# Patient Record
Sex: Female | Born: 1970 | Race: White | Hispanic: Yes | Marital: Married | Smoking: Never smoker
Health system: Southern US, Community
[De-identification: ages and names within clinical notes are randomized; demographics above are authoritative.]

## PROBLEM LIST (undated history)

## (undated) DIAGNOSIS — N979 Female infertility, unspecified: Secondary | ICD-10-CM

## (undated) DIAGNOSIS — F419 Anxiety disorder, unspecified: Secondary | ICD-10-CM

## (undated) DIAGNOSIS — N809 Endometriosis, unspecified: Secondary | ICD-10-CM

## (undated) DIAGNOSIS — I341 Nonrheumatic mitral (valve) prolapse: Secondary | ICD-10-CM

## (undated) DIAGNOSIS — E349 Endocrine disorder, unspecified: Secondary | ICD-10-CM

## (undated) DIAGNOSIS — N200 Calculus of kidney: Secondary | ICD-10-CM

## (undated) HISTORY — PX: OTHER SURGICAL HISTORY: SHX169

## (undated) HISTORY — DX: Female infertility, unspecified: N97.9

## (undated) HISTORY — DX: Calculus of kidney: N20.0

## (undated) HISTORY — DX: Nonrheumatic mitral (valve) prolapse: I34.1

## (undated) HISTORY — DX: Endocrine disorder, unspecified: E34.9

---

## 2015-12-17 DIAGNOSIS — N200 Calculus of kidney: Secondary | ICD-10-CM | POA: Insufficient documentation

## 2015-12-21 ENCOUNTER — Emergency Department (HOSPITAL_BASED_OUTPATIENT_CLINIC_OR_DEPARTMENT_OTHER)
Admission: EM | Admit: 2015-12-21 | Discharge: 2015-12-22 | Disposition: A | Discharge status: Home / Self Care | Payer: BLUE CROSS/BLUE SHIELD | Attending: Emergency Medicine | Admitting: Emergency Medicine

## 2015-12-21 ENCOUNTER — Emergency Department (HOSPITAL_BASED_OUTPATIENT_CLINIC_OR_DEPARTMENT_OTHER): Payer: BLUE CROSS/BLUE SHIELD

## 2015-12-21 ENCOUNTER — Encounter (HOSPITAL_BASED_OUTPATIENT_CLINIC_OR_DEPARTMENT_OTHER): Payer: Self-pay | Admitting: *Deleted

## 2015-12-21 DIAGNOSIS — R109 Unspecified abdominal pain: Secondary | ICD-10-CM | POA: Diagnosis present

## 2015-12-21 DIAGNOSIS — Z3202 Encounter for pregnancy test, result negative: Secondary | ICD-10-CM | POA: Insufficient documentation

## 2015-12-21 DIAGNOSIS — F419 Anxiety disorder, unspecified: Secondary | ICD-10-CM | POA: Insufficient documentation

## 2015-12-21 DIAGNOSIS — N200 Calculus of kidney: Secondary | ICD-10-CM

## 2015-12-21 HISTORY — DX: Anxiety disorder, unspecified: F41.9

## 2015-12-21 LAB — URINALYSIS, ROUTINE W REFLEX MICROSCOPIC
BILIRUBIN URINE: NEGATIVE
Glucose, UA: NEGATIVE mg/dL
KETONES UR: NEGATIVE mg/dL
NITRITE: NEGATIVE
PH: 5.5 (ref 5.0–8.0)
PROTEIN: NEGATIVE mg/dL
Specific Gravity, Urine: 1.019 (ref 1.005–1.030)

## 2015-12-21 LAB — URINE MICROSCOPIC-ADD ON

## 2015-12-21 LAB — PREGNANCY, URINE: Preg Test, Ur: NEGATIVE

## 2015-12-21 MED ORDER — OXYCODONE-ACETAMINOPHEN 5-325 MG PO TABS: 1.0000 | ORAL_TABLET | Freq: Four times a day (QID) | ORAL | Status: DC | PRN

## 2015-12-21 MED ORDER — ONDANSETRON HCL 4 MG/2ML IJ SOLN
4.0000 mg | Freq: Once | INTRAMUSCULAR | Status: AC
  Administered 2015-12-21: 4 mg via INTRAVENOUS
  Filled 2015-12-21: qty 2

## 2015-12-21 MED ORDER — KETOROLAC TROMETHAMINE 30 MG/ML IJ SOLN
30.0000 mg | Freq: Once | INTRAMUSCULAR | Status: AC
  Administered 2015-12-21: 30 mg via INTRAVENOUS
  Filled 2015-12-21: qty 1

## 2015-12-21 MED ORDER — MORPHINE SULFATE (PF) 4 MG/ML IV SOLN
4.0000 mg | Freq: Once | INTRAVENOUS | Status: AC
  Administered 2015-12-21: 4 mg via INTRAVENOUS
  Filled 2015-12-21: qty 1

## 2015-12-21 MED ORDER — ONDANSETRON 8 MG PO TBDP: ORAL_TABLET | ORAL | Status: DC

## 2015-12-21 NOTE — ED Notes (Signed)
Abdominal pain in her right flank. Kidney stone in her left flank last week.

## 2015-12-21 NOTE — Discharge Instructions (Signed)
Percocet as prescribed as needed for pain. Zofran as prescribed as needed for nausea.  Follow-up with [facility] if you have not past the stone in the next 2-3 days. The contact information for their office has been provided in this discharge summary.   Kidney Stones Kidney stones (urolithiasis) are deposits that form inside your kidneys. The intense pain is caused by the stone moving through the urinary tract. When the stone moves, the ureter goes into spasm around the stone. The stone is usually passed in the urine.  CAUSES   A disorder that makes certain neck glands produce too much parathyroid hormone (primary hyperparathyroidism).  A buildup of uric acid crystals, similar to gout in your joints.  Narrowing (stricture) of the ureter.  A kidney obstruction present at birth (congenital obstruction).  Previous surgery on the kidney or ureters.  Numerous kidney infections. SYMPTOMS   Feeling sick to your stomach (nauseous).  Throwing up (vomiting).  Blood in the urine (hematuria).  Pain that usually spreads (radiates) to the groin.  Frequency or urgency of urination. DIAGNOSIS   Taking a history and physical exam.  Blood or urine tests.  CT scan.  Occasionally, an examination of the inside of the urinary bladder (cystoscopy) is performed. TREATMENT   Observation.  Increasing your fluid intake.  Extracorporeal shock wave lithotripsy--This is a noninvasive procedure that uses shock waves to break up kidney stones.  Surgery may be needed if you have severe pain or persistent obstruction. There are various surgical procedures. Most of the procedures are performed with the use of small instruments. Only small incisions are needed to accommodate these instruments, so recovery time is minimized. The size, location, and chemical composition are all important variables that will determine the proper choice of action for you. Talk to your health care provider to better  understand your situation so that you will minimize the risk of injury to yourself and your kidney.  HOME CARE INSTRUCTIONS   Drink enough water and fluids to keep your urine clear or pale yellow. This will help you to pass the stone or stone fragments.  Strain all urine through the provided strainer. Keep all particulate matter and stones for your health care provider to see. The stone causing the pain may be as small as a grain of salt. It is very important to use the strainer each and every time you pass your urine. The collection of your stone will allow your health care provider to analyze it and verify that a stone has actually passed. The stone analysis will often identify what you can do to reduce the incidence of recurrences.  Only take over-the-counter or prescription medicines for pain, discomfort, or fever as directed by your health care provider.  Keep all follow-up visits as told by your health care provider. This is important.  Get follow-up X-rays if required. The absence of pain does not always mean that the stone has passed. It may have only stopped moving. If the urine remains completely obstructed, it can cause loss of kidney function or even complete destruction of the kidney. It is your responsibility to make sure X-rays and follow-ups are completed. Ultrasounds of the kidney can show blockages and the status of the kidney. Ultrasounds are not associated with any radiation and can be performed easily in a matter of minutes.  Make changes to your daily diet as told by your health care provider. You may be told to:  Limit the amount of salt that you eat.  Eat 5 or more servings of fruits and vegetables each day.  Limit the amount of meat, poultry, fish, and eggs that you eat.  Collect a 24-hour urine sample as told by your health care provider.You may need to collect another urine sample every 6-12 months. SEEK MEDICAL CARE IF:  You experience pain that is progressive and  unresponsive to any pain medicine you have been prescribed. SEEK IMMEDIATE MEDICAL CARE IF:   Pain cannot be controlled with the prescribed medicine.  You have a fever or shaking chills.  The severity or intensity of pain increases over 18 hours and is not relieved by pain medicine.  You develop a new onset of abdominal pain.  You feel faint or pass out.  You are unable to urinate.   This information is not intended to replace advice given to you by your health care provider. Make sure you discuss any questions you have with your health care provider.   Document Released: 10/24/2005 Document Revised: 07/15/2015 Document Reviewed: 03/27/2013 Elsevier Interactive Patient Education [employer]

## 2015-12-21 NOTE — ED Provider Notes (Signed)
CSN: [id number]     Arrival date & time 12/21/15  2005 History  By signing my name below, I, Linus Galas, attest that this documentation has been prepared under the direction and in the presence of Geoffery Lyons, MD. Electronically Signed: Linus Galas, ED Scribe. 12/21/2015. 8:40 PM.   Chief Complaint  Patient presents with  . Abdominal Pain   The history is provided by the patient. No language interpreter was used.   HPI Comments: Christy Mitchell is a 45 y.o. female with a PMHx of kidney stones and anxiety who presents to the Emergency Department complaining of right flank pain that began today. Pt states last week she was seen at [facility] and had blood work and x-ray imaging performed for left flank pain. She states they found a 7 mm kidney stone. Pt also reports urinary urgency but is unable to urinate. Pt denies any other symptoms at this time. Pt last had a kidney stone in 2010.  Past Medical History  Diagnosis Date  . Anxiety    Past Surgical History  Procedure Laterality Date  . Cesarean section     No family history on file. Social History  Substance Use Topics  . Smoking status: Never Smoker   . Smokeless tobacco: None  . Alcohol Use: No   OB History    No data available     Review of Systems  Gastrointestinal:       + right flank pain  Genitourinary: Positive for urgency.  All other systems reviewed and are negative.  Allergies  Review of patient's allergies indicates no known allergies.  Home Medications   Prior to Admission medications   Medication Sig Start Date End Date Taking? Authorizing Provider  FLUoxetine HCl (PROZAC PO) Take by mouth.   Yes Historical Provider, MD   BP 126/86 mmHg  Pulse 80  Temp(Src) 98.4 F (36.9 C) (Oral)  Resp 20  Ht  (1.778 m)  Wt 194 lb 0.1 oz (88 kg)  BMI 27.84 kg/m2  SpO2 100%   Physical Exam  Constitutional: She is oriented to person, place, and time. She appears well-developed and well-nourished. No  distress.  HENT:  Head: Normocephalic and atraumatic.  Mouth/Throat: Oropharynx is clear and moist. No oropharyngeal exudate.  Eyes: Conjunctivae and EOM are normal. Pupils are equal, round, and reactive to light.  Neck: Normal range of motion. Neck supple.  No meningismus.  Cardiovascular: Normal rate, regular rhythm, normal heart sounds and intact distal pulses.   No murmur heard. Pulmonary/Chest: Effort normal and breath sounds normal. No respiratory distress.  Abdominal: Soft. There is no tenderness. There is no rebound and no guarding.  TTP in the right flank and right mid abdomen.   Musculoskeletal: Normal range of motion. She exhibits no edema or tenderness.  Neurological: She is alert and oriented to person, place, and time. No cranial nerve deficit. She exhibits normal muscle tone. Coordination normal.  No ataxia on finger to nose bilaterally. No pronator drift. 5/5 strength throughout. CN 2-12 intact.Equal grip strength. Sensation intact.   Skin: Skin is warm.  Psychiatric: She has a normal mood and affect. Her behavior is normal.  Nursing note and vitals reviewed.   ED Course  Procedures   DIAGNOSTIC STUDIES: Oxygen Saturation is 100% on room air, normal  by my interpretation.    COORDINATION OF CARE: 8:32 PM Will order imaging and urinalysis.  Discussed treatment plan with pt at bedside and pt agreed to plan.  Labs Review Labs Reviewed  URINALYSIS, ROUTINE W REFLEX MICROSCOPIC (NOT AT [facility])    Imaging Review No results found. I have personally reviewed and evaluated these images and lab results as part of my medical decision-making.   MDM   Final diagnoses:  None    Patient presents here with complaints of severe right flank pain. She was diagnosed with a left-sided renal calculus last week, however this pain improved. Workup reveals hematuria on the UA and CT scan reveals a 4 mm stone in the right UVJ. Her pain is significantly improved with medications here  in the ER. She will be discharged with Percocet, Zofran, and when necessary follow-up with urology if her stone does not pass in a timely fashion.  I personally performed the services described in this documentation, which was scribed in my presence. The recorded information has been reviewed and is accurate.        Geoffery Lyons, MD 12/21/15 2306

## 2015-12-21 NOTE — ED Notes (Signed)
Pt. Has returned from Radiology after CT Renal study done.

## 2017-01-10 DIAGNOSIS — F4329 Adjustment disorder with other symptoms: Secondary | ICD-10-CM | POA: Insufficient documentation

## 2017-01-11 ENCOUNTER — Encounter (HOSPITAL_BASED_OUTPATIENT_CLINIC_OR_DEPARTMENT_OTHER): Payer: Self-pay | Admitting: Emergency Medicine

## 2017-01-11 ENCOUNTER — Emergency Department (HOSPITAL_BASED_OUTPATIENT_CLINIC_OR_DEPARTMENT_OTHER)
Admission: EM | Admit: 2017-01-11 | Discharge: 2017-01-11 | Disposition: A | Discharge status: Home / Self Care | Payer: BLUE CROSS/BLUE SHIELD | Attending: Emergency Medicine | Admitting: Emergency Medicine

## 2017-01-11 ENCOUNTER — Emergency Department (HOSPITAL_BASED_OUTPATIENT_CLINIC_OR_DEPARTMENT_OTHER): Payer: BLUE CROSS/BLUE SHIELD

## 2017-01-11 DIAGNOSIS — N201 Calculus of ureter: Secondary | ICD-10-CM

## 2017-01-11 DIAGNOSIS — R1032 Left lower quadrant pain: Secondary | ICD-10-CM | POA: Diagnosis present

## 2017-01-11 DIAGNOSIS — Z79899 Other long term (current) drug therapy: Secondary | ICD-10-CM | POA: Insufficient documentation

## 2017-01-11 DIAGNOSIS — R110 Nausea: Secondary | ICD-10-CM

## 2017-01-11 HISTORY — DX: Endometriosis, unspecified: N80.9

## 2017-01-11 LAB — COMPREHENSIVE METABOLIC PANEL
ALBUMIN: 3.4 g/dL — AB (ref 3.5–5.0)
ALK PHOS: 50 U/L (ref 38–126)
ALT: 10 U/L — AB (ref 14–54)
AST: 18 U/L (ref 15–41)
Anion gap: 5 (ref 5–15)
BUN: 13 mg/dL (ref 6–20)
CHLORIDE: 106 mmol/L (ref 101–111)
CO2: 23 mmol/L (ref 22–32)
CREATININE: 0.87 mg/dL (ref 0.44–1.00)
Calcium: 8.5 mg/dL — ABNORMAL LOW (ref 8.9–10.3)
GFR calc non Af Amer: >60 mL/min (ref >60)
GLUCOSE: 93 mg/dL (ref 65–99)
Potassium: 3.8 mmol/L (ref 3.5–5.1)
SODIUM: 134 mmol/L — AB (ref 135–145)
Total Bilirubin: 0.4 mg/dL (ref 0.3–1.2)
Total Protein: 7 g/dL (ref 6.5–8.1)

## 2017-01-11 LAB — URINALYSIS, ROUTINE W REFLEX MICROSCOPIC
BILIRUBIN URINE: NEGATIVE
GLUCOSE, UA: NEGATIVE mg/dL
Ketones, ur: NEGATIVE mg/dL
Leukocytes, UA: NEGATIVE
Nitrite: NEGATIVE
Protein, ur: NEGATIVE mg/dL
Specific Gravity, Urine: 1.012 (ref 1.005–1.030)
pH: 6 (ref 5.0–8.0)

## 2017-01-11 LAB — URINALYSIS, MICROSCOPIC (REFLEX): WBC, UA: NONE SEEN WBC/hpf (ref 0–5)

## 2017-01-11 LAB — CBC
HCT: 38.1 % (ref 36.0–46.0)
Hemoglobin: 12.7 g/dL (ref 12.0–15.0)
MCH: 28.1 pg (ref 26.0–34.0)
MCHC: 33.3 g/dL (ref 30.0–36.0)
MCV: 84.3 fL (ref 78.0–100.0)
PLATELETS: 256 10*3/uL (ref 150–400)
RBC: 4.52 MIL/uL (ref 3.87–5.11)
RDW: 14 % (ref 11.5–15.5)
WBC: 8.4 10*3/uL (ref 4.0–10.5)

## 2017-01-11 LAB — LIPASE, BLOOD: LIPASE: 18 U/L (ref 11–51)

## 2017-01-11 LAB — PREGNANCY, URINE: Preg Test, Ur: NEGATIVE

## 2017-01-11 MED ORDER — IOPAMIDOL (ISOVUE-300) INJECTION 61%
100.0000 mL | Freq: Once | INTRAVENOUS | Status: AC | PRN
  Administered 2017-01-11: 100 mL via INTRAVENOUS

## 2017-01-11 MED ORDER — ONDANSETRON 4 MG PO TBDP: 4.0000 mg | ORAL_TABLET | Freq: Three times a day (TID) | ORAL | 0 refills | Status: DC | PRN

## 2017-01-11 MED ORDER — ONDANSETRON HCL 4 MG/2ML IJ SOLN
4.0000 mg | Freq: Once | INTRAMUSCULAR | Status: AC
  Administered 2017-01-11: 4 mg via INTRAVENOUS
  Filled 2017-01-11: qty 2

## 2017-01-11 MED ORDER — SODIUM CHLORIDE 0.9 % IV BOLUS (SEPSIS)
1000.0000 mL | Freq: Once | INTRAVENOUS | Status: AC
  Administered 2017-01-11: 1000 mL via INTRAVENOUS

## 2017-01-11 MED ORDER — MORPHINE SULFATE (PF) 4 MG/ML IV SOLN
4.0000 mg | Freq: Once | INTRAVENOUS | Status: AC
  Administered 2017-01-11: 4 mg via INTRAVENOUS
  Filled 2017-01-11: qty 1

## 2017-01-11 MED FILL — ONDANSETRON ODT 4 MG TABLET: 4 | 3 days supply | Qty: 9 | Fill #0

## 2017-01-11 NOTE — ED Notes (Signed)
Two warm blankets given to Pt 

## 2017-01-11 NOTE — ED Notes (Signed)
ED Provider at bedside. 

## 2017-01-11 NOTE — ED Triage Notes (Signed)
LLQ abd pain with N/V x 3 days, [facility], given abx, pain meds and flomax for possible kidney stone. Pt states she has been unable to keep the meds down due to vomiting. Denies urinary symptoms.

## 2017-01-11 NOTE — ED Provider Notes (Signed)
[facility] Provider Note   CSN: [id number] Arrival date & time: 01/11/17  7829     History   Chief Complaint Chief Complaint  Patient presents with  . Abdominal Pain    HPI Christy Mitchell is a 46 y.o. female.  The history is provided by the patient and medical records.  Abdominal Pain   Associated symptoms include diarrhea, nausea and vomiting.    46 year old female with history of anxiety and endometriosis, presenting to the ED for abdominal pain. Reports this is been ongoing for the past 3 days. She was seen in urgent care yesterday and given antibiotics and pain medication as well as Flomax for possible kidney stone. Patient reports she does have a history of kidney stones, however this feels very different. She reports ongoing left upper and lower abdominal pain, nausea, vomiting, and diarrhea. No flank pain.  States she never had nausea or vomiting or with her prior kidney stones. She denies any urinary symptoms such as urinary frequency, dysuria, or difficulty urinating, which she had previously. She denies any fever. States she has tried taking the medications given to her yesterday, however she is not been able to hold them down. She denies any significant GI history. Only prior surgery was C-section.  Past Medical History:  Diagnosis Date  . Anxiety   . Endometriosis     There are no active problems to display for this patient.   Past Surgical History:  Procedure Laterality Date  . CESAREAN SECTION      OB History    No data available       Home Medications    Prior to Admission medications   Medication Sig Start Date End Date Taking? Authorizing Provider  amoxicillin-clavulanate (AUGMENTIN) 875-125 MG tablet Take 1 tablet by mouth 2 (two) times daily.   Yes Historical Provider, MD  diclofenac (VOLTAREN) 75 MG EC tablet Take 75 mg by mouth 2 (two) times daily.   Yes Historical Provider, MD  tamsulosin (FLOMAX) 0.4 MG CAPS capsule Take 0.4 mg  by mouth.   Yes Historical Provider, MD  FLUoxetine HCl (PROZAC PO) Take by mouth.    Historical Provider, MD  ondansetron (ZOFRAN ODT) 8 MG disintegrating tablet 8mg  ODT q4 hours prn nausea 12/21/15   Geoffery Lyons, MD  oxyCODONE-acetaminophen (PERCOCET) 5-325 MG tablet Take 1-2 tablets by mouth every 6 (six) hours as needed. 12/21/15   Geoffery Lyons, MD    Family History No family history on file.  Social History Social History  Substance Use Topics  . Smoking status: Never Smoker  . Smokeless tobacco: Never Used  . Alcohol use No     Allergies   Patient has no known allergies.   Review of Systems Review of Systems  Gastrointestinal: Positive for abdominal pain, diarrhea, nausea and vomiting.  All other systems reviewed and are negative.    Physical Exam Updated Vital Signs BP 132/89 (BP Location: Left Arm)   Pulse 95   Temp 98.2 F (36.8 C) (Oral)   Resp 18   Ht 5' 10.5" (1.791 m)   Wt 96.2 kg   LMP 01/09/2017   SpO2 99%   BMI 29.99 kg/m   Physical Exam  Constitutional: She is oriented to person, place, and time. She appears well-developed and well-nourished.  HENT:  Head: Normocephalic and atraumatic.  Mouth/Throat: Oropharynx is clear and moist.  Eyes: Conjunctivae and EOM are normal. Pupils are equal, round, and reactive to light.  Neck: Normal range of motion.  Cardiovascular:  Normal rate, regular rhythm and normal heart sounds.   Pulmonary/Chest: Effort normal and breath sounds normal. No respiratory distress. She has no wheezes.  Abdominal: Soft. Bowel sounds are normal. There is tenderness in the left upper quadrant and left lower quadrant. There is no rebound, no guarding and no CVA tenderness.  TTP in LLQ and LUQ, no rebound or guarding, no CVA tenderness  Musculoskeletal: Normal range of motion.  Neurological: She is alert and oriented to person, place, and time.  Skin: Skin is warm and dry.  Psychiatric: She has a normal mood and affect.  Nursing  note and vitals reviewed.    ED Treatments / Results  Labs (all labs ordered are listed, but only abnormal results are displayed) Labs Reviewed  COMPREHENSIVE METABOLIC PANEL - Abnormal; Notable for the following:       Result Value   Sodium 134 (*)    Calcium 8.5 (*)    Albumin 3.4 (*)    ALT 10 (*)    All other components within normal limits  URINALYSIS, ROUTINE W REFLEX MICROSCOPIC - Abnormal; Notable for the following:    Hgb urine dipstick MODERATE (*)    All other components within normal limits  URINALYSIS, MICROSCOPIC (REFLEX) - Abnormal; Notable for the following:    Bacteria, UA RARE (*)    Squamous Epithelial / LPF 0-5 (*)    All other components within normal limits  LIPASE, BLOOD  CBC  PREGNANCY, URINE    EKG  EKG Interpretation None       Radiology Ct Abdomen Pelvis W Contrast  Result Date: 01/11/2017 CLINICAL DATA:  Left-sided abdominal pain for several days EXAM: CT ABDOMEN AND PELVIS WITH CONTRAST TECHNIQUE: Multidetector CT imaging of the abdomen and pelvis was performed using the standard protocol following bolus administration of intravenous contrast. CONTRAST:  ISOVUE-300 IOPAMIDOL (ISOVUE-300) INJECTION 61% COMPARISON:  12/21/2015 FINDINGS: Lower chest: No acute abnormality. Hepatobiliary: No focal liver abnormality is seen. No gallstones, gallbladder wall thickening, or biliary dilatation. Pancreas: Unremarkable. No pancreatic ductal dilatation or surrounding inflammatory changes. Spleen: Normal in size without focal abnormality. Adrenals/Urinary Tract: The adrenal glands are within normal limits bilaterally. Small nonobstructing right renal calculi are seen. The largest of these measures 4 mm in greatest dimension. No right ureteral stones are noted. The bladder is well distended. The left kidney also demonstrates small nonobstructing renal calculi. Additionally decreased attenuation of the left kidney is noted related to underlying hydronephrosis.  This is secondary to a irregular 10 mm proximal left ureteral stone. This is best visualized on the coronal imaging. The left ureter distal though this is also dilated and a second calculus measuring 5 mm is noted just below the pelvic inlet. The more distal left ureter is within normal limits. Stomach/Bowel: Stomach is within normal limits. Appendix appears normal. No evidence of bowel wall thickening, distention, or inflammatory changes. Vascular/Lymphatic: No significant vascular findings are present. No enlarged abdominal or pelvic lymph nodes. Reproductive: Uterus and bilateral adnexa are unremarkable. Tampon is noted within the vaginal vault. Other: No abdominal wall hernia or abnormality. No abdominopelvic ascites. Musculoskeletal: Mild degenerative changes of the lumbar spine are noted. IMPRESSION: Nonobstructing renal calculi bilaterally. Two left ureteral stones causing obstructive change. A 10 mm stone is noted in the proximal left ureter is noted as well as a 5 mm mid to distal left ureteral stone. No other focal abnormality is noted. Electronically Signed   By: Alcide Clever M.D.   On: 01/11/2017 10:15  Procedures Procedures (including critical care time)  Medications Ordered in ED Medications - No data to display   Initial Impression / Assessment and Plan / ED Course  I have reviewed the triage vital signs and the nursing notes.  Pertinent labs & imaging results that were available during my care of the patient were reviewed by me and considered in my medical decision making (see chart for details).  46 year old female here with abdominal pain she reports left upper and lower abdominal pain as well as nausea, vomiting, diarrhea. Reports this feels different than her prior stones. Seen at urgent care yesterday, but continues vomiting despite medications. She is afebrile and nontoxic. She does have left upper and lower tenderness on exam without rebound or guarding. No CVA tenderness.  Will plan for labs, urinalysis, CT scan.  Labwork is overall reassuring, renal function preserved.  UA with blood noted, no overt signs of infection. CT scan does reveal to left ureteral calculi, one 10 mm, the other 5 mm. After single dose of pain and nausea medication as well as IV fluids, patient reports resolution of her pain. She is tolerating oral fluids well at this time without any active emesis her in the ED. She does not currently follow-up with urology. I referred her to the urologist on call. She was made aware of sizes of her stones, and fact that she may have some difficulty passing these as they are quite large, esp one that is 10mm.  She will follow-up in clinic as soon as possible.  Will have her continue pain medication and flomax, will add zofran.  Given UA shows no signs of infection, feel she can discontinue the abx.  Discussed plan with patient, she acknowledged understanding and agreed with plan of care.  Return precautions given for new or worsening symptoms.  Final Clinical Impressions(s) / ED Diagnoses   Final diagnoses:  Left ureteral stone  Nausea    New Prescriptions New Prescriptions   ONDANSETRON (ZOFRAN ODT) 4 MG DISINTEGRATING TABLET    Take 1 tablet (4 mg total) by mouth every 8 (eight) hours as needed for nausea.     Garlon HatchetLisa M Logon [place], PA-C 01/11/17 1244    Geoffery Lyonsouglas Delo, MD 01/11/17 [phone]

## 2017-01-11 NOTE — Discharge Instructions (Signed)
As we discussed, you have 2 stones in the left ureter.  One is quite large so I do recommend that you follow-up with urology about this. Continue the pain medication and flomax.  Your urine did not appear infected today so you can stop the augmentin.  Use zofran when needed for nausea. Return to the ED for new or worsening symptoms.

## 2017-01-12 DIAGNOSIS — E663 Overweight: Secondary | ICD-10-CM | POA: Insufficient documentation

## 2017-01-13 ENCOUNTER — Other Ambulatory Visit: Payer: Self-pay | Admitting: Urology

## 2017-01-13 NOTE — H&P (Signed)
Office Visit Report     01/13/2017   --------------------------------------------------------------------------------   Christy Mitchell  MRN: [id number]  PRIMARY CARE:    DOB: 03-30-1971, 46 year old Female  REFERRING:    SSN: [id number]  PROVIDER:  Heloise PurpuraLester Kaleyah Mitchell, M.D.    LOCATION:  [facility] - [phone]   --------------------------------------------------------------------------------   CC/HPI: Kidney stone   Christy Mitchell is a 46 year old female seen today as an outpatient. She has a history of recurrent urolithiasis dating back to 2010 when she had her first stone event. She has apparently spontaneously passed stones in 2010, 2014, and 17. On Tuesday, she developed the acute onset of severe left upper quadrant pain. This was associated with significant nausea and vomiting but felt different than her prior kidney stone episodes. She denied any gross hematuria and has not had any fevers. She presented to the emergency department on 01/11/17 and underwent evaluation including a CT scan of the abdomen and pelvis with contrast. This confirmed nonobstructing bilateral renal calculi along with 2 left ureteral calculi including a 10 mm proximal left ureteral stone and a 5 mm/distal left ureteral stone. She was provided a prescription for Percocet and tamsulosin. Currently, she denies significant pain and her nausea and vomiting has been controlled.     ALLERGIES: None   MEDICATIONS: Diclofenac Potassium  Fluoxetine Hcl  Christy Mitchell 0.02 mg-3 mg (24) tablet     GU PSH: None   NON-GU PSH: None   GU PMH: None   NON-GU PMH: Anxiety    FAMILY HISTORY: 1 Daughter - Runs in Family   SOCIAL HISTORY: Marital Status: Married Current Smoking Status: Patient has never smoked.   Tobacco Use Assessment Completed: Used Tobacco in last 30 days? Does not drink anymore.  Drinks 3 caffeinated drinks per day.    REVIEW OF SYSTEMS:    GU Review Female:   Patient reports frequent  urination, hard to postpone urination, get up at night to urinate, and leakage of urine. Patient denies burning /pain with urination, stream starts and stops, trouble starting your stream, have to strain to urinate, and currently pregnant.  Gastrointestinal (Upper):   Patient reports nausea and vomiting.   Gastrointestinal (Lower):   Patient reports diarrhea. Patient denies constipation.  Constitutional:   Patient denies fever, night sweats, weight loss, and fatigue.  Skin:   Patient denies skin rash/ lesion and itching.  Eyes:   Patient denies blurred vision and double vision.  Ears/ Nose/ Throat:   Patient denies sore throat and sinus problems.  Hematologic/Lymphatic:   Patient denies swollen glands and easy bruising.  Cardiovascular:   Patient denies leg swelling and chest pains.  Respiratory:   Patient denies cough and shortness of breath.  Endocrine:   Patient denies excessive thirst.  Musculoskeletal:   Patient reports back pain. Patient denies joint pain.  Neurological:   Patient reports headaches and dizziness.   Psychologic:   Patient reports anxiety. Patient denies depression.   VITAL SIGNS:      01/13/2017 03:49 PM  Weight 209 lb / 94.8 kg  Height 68 in / 172.72 cm  BP 119/73 mmHg  Pulse 106 /min  BMI 31.8 kg/m   MULTI-SYSTEM PHYSICAL EXAMINATION:    Constitutional: Well-nourished. No physical deformities. Normally developed. Good grooming.  Neck: Neck symmetrical, not swollen. Normal tracheal position.  Respiratory: No labored breathing, no use of accessory muscles. Clear bilaterally.  Cardiovascular: Normal temperature, normal extremity pulses, no swelling, no varicosities. Regular rate and rhythm.  Lymphatic: No  enlargement of neck, axillae, groin.  Skin: No paleness, no jaundice, no cyanosis. No lesion, no ulcer, no rash.  Neurologic / Psychiatric: Oriented to time, oriented to place, oriented to person. No depression, no anxiety, no agitation.  Gastrointestinal: Soft,  nontender, no CVA tenderness.  Eyes: Normal conjunctivae. Normal eyelids.  Ears, Nose, Mouth, and Throat: Left ear no scars, no lesions, no masses. Right ear no scars, no lesions, no masses. Nose no scars, no lesions, no masses. Normal hearing. Normal lips.  Musculoskeletal: Normal gait and station of head and neck.     PAST DATA REVIEWED:  Source Of History:  Patient  Lab Test Review:   CMP  Records Review:   Previous Patient Records  Urine Test Review:   Urinalysis  X-Ray Review: C.T. Abdomen/Pelvis: Reviewed Films. Findings are as dictated above.    PROCEDURES:          Urinalysis w/Scope Dipstick Dipstick Cont'd Micro  Color: Amber Bilirubin: Neg WBC/hpf: 0 - 5/hpf  Appearance: Cloudy Ketones: Neg RBC/hpf: >60/hpf  Specific Gravity: 1.025 Blood: 3+ Bacteria: Rare (0-9/hpf)  pH: 6.0 Protein: Trace Cystals: NS (Not Seen)  Glucose: Neg Urobilinogen: 0.2 Casts: NS (Not Seen)    Nitrites: Neg Trichomonas: Not Present    Leukocyte Esterase: Neg Mucous: Not Present      Epithelial Cells: 0 - 5/hpf      Yeast: NS (Not Seen)      Sperm: Not Present    ASSESSMENT:      ICD-10 Details  1 GU:   Calculus Ureter - N20.1   2   Kidney Stone - N20.0    PLAN:            Medications New Meds: Oxycodone-Acetaminophen 5 mg-325 mg tablet 1-2 tablet PO Q 6 H prn   #20  0 Refill(s)            Schedule Return Visit/Planned Activity: Other See Visit Notes             Note: Pt to be scheduled for surgery          Document Letter(s):  Created for Patient: Clinical Summary         Notes:   1. Two left ureteral calculi: I advised Christy Mitchell that she is not likely to pass her stones considering the large size of the proximal stone. We discussed options for management/treatment. Considering the fact that she has multiple stones, I did not recommend shockwave lithotripsy but rather recommended ureteroscopic laser lithotripsy as her best option. We have reviewed this procedure in detail including  the potential risks, complications, and alternative treatment options. We discussed the expected recovery process. She does wish to proceed and gives informed consent to do so.   She has been provided additional Percocet to use as needed and for after her upcoming surgery which will be scheduled for Monday. She has been advised to call sooner should she develop fever, persistent nausea/vomiting, or uncontrolled pain.   She will undergo cystoscopy, left retrograde pyelography, left ureteroscopy with laser lithotripsy, and left ureteral stent placement. I will plan to treat both of her left ureteral calculi and will plan to proceed with renoscopy as well to determine whether any of her additional left renal stones can be removed at the same setting.   2. Recurrent urolithiasis: I have recommended that we proceed with a full metabolic evaluation following resolution of her acute stone episode.   Cc: Dr. Pearson Grippe

## 2017-01-16 ENCOUNTER — Ambulatory Visit (HOSPITAL_COMMUNITY)
Admission: RE | Admit: 2017-01-16 | Discharge: 2017-01-16 | Disposition: A | Discharge status: Home / Self Care | Payer: BLUE CROSS/BLUE SHIELD | Source: Ambulatory Visit | Attending: Urology | Admitting: Urology

## 2017-01-16 ENCOUNTER — Ambulatory Visit (HOSPITAL_COMMUNITY): Payer: BLUE CROSS/BLUE SHIELD | Admitting: Anesthesiology

## 2017-01-16 ENCOUNTER — Encounter (HOSPITAL_COMMUNITY)
Admission: RE | Disposition: A | Discharge status: Home / Self Care | Payer: Self-pay | Source: Ambulatory Visit | Attending: Urology

## 2017-01-16 ENCOUNTER — Encounter (HOSPITAL_COMMUNITY): Payer: Self-pay

## 2017-01-16 DIAGNOSIS — Z79899 Other long term (current) drug therapy: Secondary | ICD-10-CM | POA: Insufficient documentation

## 2017-01-16 DIAGNOSIS — N202 Calculus of kidney with calculus of ureter: Secondary | ICD-10-CM | POA: Diagnosis present

## 2017-01-16 DIAGNOSIS — F419 Anxiety disorder, unspecified: Secondary | ICD-10-CM | POA: Diagnosis not present

## 2017-01-16 HISTORY — PX: CYSTOSCOPY W/ URETERAL STENT PLACEMENT: SHX1429

## 2017-01-16 LAB — BASIC METABOLIC PANEL
ANION GAP: 8 (ref 5–15)
BUN: 11 mg/dL (ref 6–20)
CO2: 22 mmol/L (ref 22–32)
Calcium: 9.3 mg/dL (ref 8.9–10.3)
Chloride: 107 mmol/L (ref 101–111)
Creatinine, Ser: 0.91 mg/dL (ref 0.44–1.00)
GFR calc non Af Amer: >60 mL/min (ref >60)
Glucose, Bld: 79 mg/dL (ref 65–99)
POTASSIUM: 4 mmol/L (ref 3.5–5.1)
SODIUM: 137 mmol/L (ref 135–145)

## 2017-01-16 SURGERY — CYSTOSCOPY, WITH RETROGRADE PYELOGRAM AND URETERAL STENT INSERTION
Anesthesia: General | Laterality: Left

## 2017-01-16 MED ORDER — FENTANYL CITRATE (PF) 100 MCG/2ML IJ SOLN
INTRAMUSCULAR | Status: DC | PRN
  Administered 2017-01-16 (×4): 25 ug via INTRAVENOUS

## 2017-01-16 MED ORDER — PROMETHAZINE HCL 25 MG/ML IJ SOLN
6.2500 mg | INTRAMUSCULAR | Status: DC | PRN
  Administered 2017-01-16: 6.25 mg via INTRAVENOUS

## 2017-01-16 MED ORDER — PROPOFOL 10 MG/ML IV BOLUS
INTRAVENOUS | Status: AC
  Filled 2017-01-16: qty 20

## 2017-01-16 MED ORDER — LIDOCAINE 2% (20 MG/ML) 5 ML SYRINGE
INTRAMUSCULAR | Status: AC
  Filled 2017-01-16: qty 5

## 2017-01-16 MED ORDER — ONDANSETRON HCL 4 MG/2ML IJ SOLN
INTRAMUSCULAR | Status: AC
  Filled 2017-01-16: qty 2

## 2017-01-16 MED ORDER — DEXAMETHASONE SODIUM PHOSPHATE 10 MG/ML IJ SOLN
INTRAMUSCULAR | Status: DC | PRN
  Administered 2017-01-16: 10 mg via INTRAVENOUS

## 2017-01-16 MED ORDER — DEXAMETHASONE SODIUM PHOSPHATE 10 MG/ML IJ SOLN
INTRAMUSCULAR | Status: AC
  Filled 2017-01-16: qty 1

## 2017-01-16 MED ORDER — IOPAMIDOL (ISOVUE-300) INJECTION 61%
INTRAVENOUS | Status: DC | PRN
  Administered 2017-01-16: 10 mL

## 2017-01-16 MED ORDER — 0.9 % SODIUM CHLORIDE (POUR BTL) OPTIME
TOPICAL | Status: DC | PRN
  Administered 2017-01-16: 1000 mL

## 2017-01-16 MED ORDER — LACTATED RINGERS IV SOLN
INTRAVENOUS | Status: DC
  Administered 2017-01-16: 14:00:00 via INTRAVENOUS

## 2017-01-16 MED ORDER — FENTANYL CITRATE (PF) 100 MCG/2ML IJ SOLN
INTRAMUSCULAR | Status: AC
  Filled 2017-01-16: qty 2

## 2017-01-16 MED ORDER — MIDAZOLAM HCL 2 MG/2ML IJ SOLN
INTRAMUSCULAR | Status: AC
  Filled 2017-01-16: qty 2

## 2017-01-16 MED ORDER — MIDAZOLAM HCL 5 MG/5ML IJ SOLN
INTRAMUSCULAR | Status: DC | PRN
  Administered 2017-01-16: 2 mg via INTRAVENOUS

## 2017-01-16 MED ORDER — LIDOCAINE 2% (20 MG/ML) 5 ML SYRINGE
INTRAMUSCULAR | Status: DC | PRN
  Administered 2017-01-16: 50 mg via INTRAVENOUS

## 2017-01-16 MED ORDER — PROPOFOL 10 MG/ML IV BOLUS
INTRAVENOUS | Status: DC | PRN
  Administered 2017-01-16: 200 mg via INTRAVENOUS
  Administered 2017-01-16: 50 mg via INTRAVENOUS
  Administered 2017-01-16: 100 mg via INTRAVENOUS

## 2017-01-16 MED ORDER — ONDANSETRON HCL 4 MG/2ML IJ SOLN
INTRAMUSCULAR | Status: DC | PRN
  Administered 2017-01-16: 4 mg via INTRAVENOUS

## 2017-01-16 MED ORDER — SODIUM CHLORIDE 0.9 % IR SOLN
Status: DC | PRN
  Administered 2017-01-16: 3000 mL

## 2017-01-16 MED ORDER — FENTANYL CITRATE (PF) 100 MCG/2ML IJ SOLN: 25.0000 ug | INTRAMUSCULAR | Status: DC | PRN

## 2017-01-16 MED ORDER — CEFAZOLIN SODIUM-DEXTROSE 2-4 GM/100ML-% IV SOLN
2.0000 g | INTRAVENOUS | Status: AC
  Administered 2017-01-16: 2 g via INTRAVENOUS
  Filled 2017-01-16: qty 100

## 2017-01-16 MED ORDER — SODIUM CHLORIDE 0.9 % IR SOLN
Status: DC | PRN
  Administered 2017-01-16: 2000 mL

## 2017-01-16 MED ORDER — PROMETHAZINE HCL 25 MG/ML IJ SOLN
INTRAMUSCULAR | Status: AC
  Administered 2017-01-16: 6.25 mg via INTRAVENOUS
  Filled 2017-01-16: qty 1

## 2017-01-16 SURGICAL SUPPLY — 13 items
BAG URO CATCHER STRL LF (MISCELLANEOUS) ×3 IMPLANT
BASKET ZERO TIP NITINOL 2.4FR (BASKET) ×3 IMPLANT
CATH INTERMIT  6FR 70CM (CATHETERS) ×3 IMPLANT
CLOTH BEACON ORANGE TIMEOUT ST (SAFETY) ×3 IMPLANT
FIBER LASER TRAC TIP (UROLOGICAL SUPPLIES) ×3 IMPLANT
GLOVE BIOGEL M STRL SZ7.5 (GLOVE) ×3 IMPLANT
GOWN STRL REUS W/TWL LRG LVL3 (GOWN DISPOSABLE) ×6 IMPLANT
GUIDEWIRE STR DUAL SENSOR (WIRE) ×6 IMPLANT
MANIFOLD NEPTUNE II (INSTRUMENTS) ×3 IMPLANT
PACK CYSTO (CUSTOM PROCEDURE TRAY) ×3 IMPLANT
STENT CONTOUR 6FRX26X.038 (STENTS) ×3 IMPLANT
TUBING CONNECTING 10 (TUBING) ×2 IMPLANT
TUBING CONNECTING 10' (TUBING) ×1

## 2017-01-16 NOTE — Interval H&P Note (Signed)
History and Physical Interval Note:  01/16/2017 2:53 PM  Christy Mitchell  has presented today for surgery, with the diagnosis of LEFT URETERAL CALCULI  The various methods of treatment have been discussed with the patient and family. After consideration of risks, benefits and other options for treatment, the patient has consented to  Procedure(s): CYSTOSCOPY WITH RETROGRADE PYELOGRAM/URETEROSCOPY/ LASER LITHOTRIPSYURETERAL STENT PLACEMENT (Left) as a surgical intervention .  The patient's history has been reviewed, patient examined, no change in status, stable for surgery.  I have reviewed the patient's chart and labs.  Questions were answered to the patient's satisfaction.     Rashi Giuliani,LES

## 2017-01-16 NOTE — Anesthesia Procedure Notes (Signed)
Procedure Name: LMA Insertion Date/Time: 01/16/2017 3:56 PM Performed by: Paris LoreBLANTON, [place] Cloe M Pre-anesthesia Checklist: Patient identified, Emergency Drugs available, Suction available, Patient being monitored and Timeout performed Patient Re-evaluated:Patient Re-evaluated prior to inductionOxygen Delivery Method: Circle system utilized Preoxygenation: Pre-oxygenation with 100% oxygen Intubation Type: IV induction Ventilation: Mask ventilation without difficulty LMA: LMA inserted LMA Size: 4.0 Number of attempts: 1 Placement Confirmation: positive ETCO2 and breath sounds checked- equal and bilateral Tube secured with: Tape Comments: Placed per MD.

## 2017-01-16 NOTE — Transfer of Care (Signed)
Immediate Anesthesia Transfer of Care Note  Patient: Christy Mitchell  Procedure(s) Performed: Procedure(s): CYSTOSCOPY WITH RETROGRADE PYELOGRAM/URETEROSCOPY/ LASER LITHOTRIPSY/URETERAL STENT PLACEMENT (Left)  Patient Location: PACU  Anesthesia Type:General  Level of Consciousness:  sedated, patient cooperative and responds to stimulation  Airway & Oxygen Therapy:Patient Spontanous Breathing and Patient connected to face mask oxgen  Post-op Assessment:  Report given to PACU RN and Post -op Vital signs reviewed and stable  Post vital signs:  Reviewed and stable  Last Vitals:  Vitals:   01/16/17 1318  BP: 118/78  Pulse: (!) 104  Resp: 16  Temp: 36.6 C    Complications: No apparent anesthesia complications

## 2017-01-16 NOTE — Op Note (Signed)
Preoperative diagnosis: Left ureteral calculi  Postoperative diagnosis: Left ureteral calculi  Procedure:  1. Cystoscopy 2. Left ureteroscopy and stone removal 3. Ureteroscopic laser lithotripsy 4. Left ureteral stent placement (6 x 26 with string) 5. Left retrograde pyelography with interpretation  Surgeon: Moody BruinsLester S. Tinnie Kunin, Jr. M.D.  Anesthesia: General  Complications: None  Intraoperative findings: Left retrograde pyelography demonstrated a filling defect within the distal and proximal left ureter consistent with the patient's known calculi without other abnormalities noted.  EBL: Minimal  Specimens: 1. Left ureteral calculi  Disposition of specimens: [facility] for stone analysis  Indication: Christy Mitchell  is a 46 y.o. patient with urolithiasis.  She presented last week with symptomatic left ureteral calculi. After reviewing the management options for treatment, they elected to proceed with the above surgical procedure(s). We have discussed the potential benefits and risks of the procedure, side effects of the proposed treatment, the likelihood of the patient achieving the goals of the procedure, and any potential problems that might occur during the procedure or recuperation. Informed consent has been obtained.  Description of procedure:  The patient was taken to the operating room and general anesthesia was induced.  The patient was placed in the dorsal lithotomy position, prepped and draped in the usual sterile fashion, and preoperative antibiotics were administered. A preoperative time-out was performed.   Cystourethroscopy was performed.  The patient's urethra was examined and was normal. The bladder was then systematically examined in its entirety. There was no evidence for any bladder tumors, stones, or other mucosal pathology.    Attention then turned to the left ureteral orifice and a ureteral catheter was used to intubate the ureteral orifice.   Omnipaque contrast was injected through the ureteral catheter and a retrograde pyelogram was performed with findings as dictated above.  A 0.38 sensor guidewire was then advanced up the left ureter into the renal pelvis under fluoroscopic guidance. The 6 Fr semirigid ureteroscope was then advanced into the ureter next to the guidewire and the distal calculus was identified.   The stone was then fragmented with the 200 micron holmium laser fiber on a setting of 0.6 J and frequency of 6 Hz.   All stone fragments in the distal ureter were then removed from the ureter with a zero tip nitinol basket.  Reinspection of the ureter revealed no remaining visible stones or fragments.   I then advanced the semirigid scope into the mid and proximal ureter but was unable to reach the proximal stone.   A 12/14 Fr ureteral access sheath was then advanced over the guide wire. The digital flexible ureteroscope was then advanced through the access sheath into the ureter next to the guidewire and the calculus was identified and was located in the proximal ureter.   The stone was then fragmented with the 200 micron holmium laser fiber on a setting of 0.6 J and frequency of 6 Hz.   All sizable stones were then removed with a zero tip nitinol basket.  Reinspection of the ureter/renal pelvis revealed no remaining visible stones or fragments of significant size.   The safety wire was then replaced and the access sheath removed.  The guidewire was backloaded through the cystoscope and a ureteral stent was advance over the wire using Seldinger technique.  The stent was positioned appropriately under fluoroscopic and cystoscopic guidance.  The wire was then removed with an adequate stent curl noted in the renal pelvis as well as in the bladder.  The bladder was then  emptied and the procedure ended.  The patient appeared to tolerate the procedure well and without complications.  The patient was able to be awakened and  transferred to the recovery unit in satisfactory condition.   Moody Bruins MD

## 2017-01-16 NOTE — OR Nursing (Signed)
Stone taken per Dr. Borden 

## 2017-01-16 NOTE — Anesthesia Preprocedure Evaluation (Addendum)
Anesthesia Evaluation  Patient identified by MRN, date of birth, ID band Patient awake    Reviewed: Allergy & Precautions, NPO status , Patient's Chart, lab work & pertinent test results  Airway Mallampati: II  TM Distance: >3 FB Neck ROM: Full    Dental  (+) Teeth Intact, Dental Advisory Given, Implants   Pulmonary neg pulmonary ROS,    Pulmonary exam normal breath sounds clear to auscultation       Cardiovascular Normal cardiovascular exam+ Valvular Problems/Murmurs MVP  Rhythm:Regular Rate:Normal     Neuro/Psych Anxiety negative neurological ROS     GI/Hepatic negative GI ROS, Neg liver ROS,   Endo/Other  negative endocrine ROS  Renal/GU Nephrolithiasis     Musculoskeletal negative musculoskeletal ROS (+)   Abdominal   Peds  Hematology negative hematology ROS (+)   Anesthesia Other Findings Day of surgery medications reviewed with the patient.  Reproductive/Obstetrics                           Anesthesia Physical Anesthesia Plan  ASA: II  Anesthesia Plan: General   Post-op Pain Management:    Induction: Intravenous  Airway Management Planned: LMA  Additional Equipment:   Intra-op Plan:   Post-operative Plan: Extubation in OR  Informed Consent: I have reviewed the patients History and Physical, chart, labs and discussed the procedure including the risks, benefits and alternatives for the proposed anesthesia with the patient or authorized representative who has indicated his/her understanding and acceptance.   Dental advisory given  Plan Discussed with: CRNA  Anesthesia Plan Comments: (Risks/benefits of general anesthesia discussed with patient including risk of damage to teeth, lips, gum, and tongue, nausea/vomiting, allergic reactions to medications, and the possibility of heart attack, stroke and death.  All patient questions answered.  Patient wishes to proceed.)         Anesthesia Quick Evaluation

## 2017-01-16 NOTE — Anesthesia Postprocedure Evaluation (Signed)
Anesthesia Post Note  Patient: Christy Mitchell  Procedure(s) Performed: Procedure(s) (LRB): CYSTOSCOPY WITH RETROGRADE PYELOGRAM/URETEROSCOPY/ LASER LITHOTRIPSY/URETERAL STENT PLACEMENT (Left)  Patient location during evaluation: PACU Anesthesia Type: General Level of consciousness: awake and alert Pain management: pain level controlled Vital Signs Assessment: post-procedure vital signs reviewed and stable Respiratory status: spontaneous breathing, nonlabored ventilation, respiratory function stable and patient connected to nasal cannula oxygen Cardiovascular status: blood pressure returned to baseline and stable Postop Assessment: no signs of nausea or vomiting Anesthetic complications: no       Last Vitals:  Vitals:   01/16/17 1800 01/16/17 1812  BP: 107/71 120/82  Pulse: 84 88  Resp: 13 16  Temp: 36.7 C 36.7 C    Last Pain:  Vitals:   01/16/17 1800  TempSrc:   PainSc: Asleep                 Cecile HearingStephen Edward Turk

## 2017-01-16 NOTE — Discharge Instructions (Addendum)
1. You may see some blood in the urine and may have some burning with urination for 48-72 hours. You also may notice that you have to urinate more frequently or urgently after your procedure which is normal.  2. You should call should you develop an inability urinate, fever > 101, persistent nausea and vomiting that prevents you from eating or drinking to stay hydrated.  3. If you have a stent, you will likely urinate more frequently and urgently until the stent is removed and you may experience some discomfort/pain in the lower abdomen and flank especially when urinating. You may take pain medication prescribed to you if needed for pain. You may also intermittently have blood in the urine until the stent is removed.       4.   You may remove your stent on Friday morning.  Simply pull the string that is taped to your body and the stent will easily come out.  This may be best done in the shower as some urine may come out with the stent.  Usually you will feel relief once the stent is removed, but occasionally patients can develop pain due to residual swelling of the ureter that may temporarily obstruct the kidney.  This can be managed by taking pain medication and it will typically resolve with time.  Please do not hesitate to call if you have pain that is not controlled with your pain medication or does not improved within 24-48 hours.    General Anesthesia, Adult, Care After These instructions provide you with information about caring for yourself after your procedure. Your health care provider may also give you more specific instructions. Your treatment has been planned according to current medical practices, but problems sometimes occur. Call your health care provider if you have any problems or questions after your procedure. What can I expect after the procedure? After the procedure, it is common to have:  Vomiting.  A sore throat.  Mental slowness. It is common to feel:  Nauseous.  Cold or  shivery.  Sleepy.  Tired.  Sore or achy, even in parts of your body where you did not have surgery. Follow these instructions at home: For at least 24 hours after the procedure:   Do not:  Participate in activities where you could fall or become injured.  Drive.  Use heavy machinery.  Drink alcohol.  Take sleeping pills or medicines that cause drowsiness.  Make important decisions or sign legal documents.  Take care of children on your own.  Rest. Eating and drinking   If you vomit, drink water, juice, or soup when you can drink without vomiting.  Drink enough fluid to keep your urine clear or pale yellow.  Make sure you have little or no nausea before eating solid foods.  Follow the diet recommended by your health care provider. General instructions   Have a responsible adult stay with you until you are awake and alert.  Return to your normal activities as told by your health care provider. Ask your health care provider what activities are safe for you.  Take over-the-counter and prescription medicines only as told by your health care provider.  If you smoke, do not smoke without supervision.  Keep all follow-up visits as told by your health care provider. This is important. Contact a health care provider if:  You continue to have nausea or vomiting at home, and medicines are not helpful.  You cannot drink fluids or start eating again.  You cannot urinate  after 8-12 hours.  You develop a skin rash.  You have fever.  You have increasing redness at the site of your procedure. Get help right away if:  You have difficulty breathing.  You have chest pain.  You have unexpected bleeding.  You feel that you are having a life-threatening or urgent problem. This information is not intended to replace advice given to you by your health care provider. Make sure you discuss any questions you have with your health care provider. Document Released: 01/30/2001  Document Revised: 03/28/2016 Document Reviewed: 10/08/2015 Elsevier Interactive Patient Education  2017 [employer].

## 2017-01-17 ENCOUNTER — Encounter (HOSPITAL_COMMUNITY): Payer: Self-pay | Admitting: Urology

## 2018-01-25 ENCOUNTER — Other Ambulatory Visit (HOSPITAL_COMMUNITY)
Admission: RE | Admit: 2018-01-25 | Discharge: 2018-01-25 | Disposition: A | Discharge status: Home / Self Care | Payer: BLUE CROSS/BLUE SHIELD | Source: Ambulatory Visit | Attending: Obstetrics and Gynecology | Admitting: Obstetrics and Gynecology

## 2018-01-25 ENCOUNTER — Other Ambulatory Visit: Payer: Self-pay

## 2018-01-25 ENCOUNTER — Ambulatory Visit: Payer: BLUE CROSS/BLUE SHIELD | Admitting: Obstetrics and Gynecology

## 2018-01-25 ENCOUNTER — Encounter: Payer: Self-pay | Admitting: Obstetrics and Gynecology

## 2018-01-25 VITALS — BP 108/64 | HR 72 | Resp 16 | Ht 69.0 in | Wt 215.0 lb

## 2018-01-25 DIAGNOSIS — Z1211 Encounter for screening for malignant neoplasm of colon: Secondary | ICD-10-CM | POA: Diagnosis not present

## 2018-01-25 DIAGNOSIS — Z01419 Encounter for gynecological examination (general) (routine) without abnormal findings: Secondary | ICD-10-CM

## 2018-01-25 DIAGNOSIS — N951 Menopausal and female climacteric states: Secondary | ICD-10-CM | POA: Insufficient documentation

## 2018-01-25 DIAGNOSIS — R5383 Other fatigue: Secondary | ICD-10-CM

## 2018-01-25 NOTE — Progress Notes (Signed)
47 y.o. 251P0001 Married [place] female here for annual exam.    Menopausal symptoms started in 6839 - 42 while living in [place].  Began having hot flashes and had infertility.   Prior hx of endometriosis and infertility.  Had laparoscopic surgery for endometriosis.  Did IVF. In the end conceived naturally.   Started HRT last year through Dr[facility] Mitz[facility]Moody at [facility]. Did use birth control prior to this but was having mood swings.  Tried low dose HRT but did not control her hot flashes and energy well. Increased dosage of HRT and had bleeding issues. Had ultrasound and EMB which were normal.  Now back down to lower dosage.  Again has low libido, hot flashes, decreased energy, not sleeping, memory poor, and joint pain.   Her LMP was December 2018.  She had times prior to this when she skipped cycles.   Occasionally has blood in the stool. Has hemorrhoids.   Lab with PCP.   PCP:  [place] Fulbright, PA-C - [facility] [place].  Patient's last menstrual period was 10/07/2017 (within days).           Sexually active: Yes.    The current method of family planning is LOPREEZA.    Exercising: Yes.    bike, pilates, spinning, walking Smoker:  no  Health Maintenance: Pap:  2016 Pap smear negaitve -- in Care Everywhere History of abnormal Pap:  no MMG:  2018 per patient done at [facility] -- normal per patient TDaP:  unsure Gardasil:   no HIV: negative in past Hep C: negative in past Screening Labs:  Discuss today   reports that she has never smoked. She has never used smokeless tobacco. She reports that she does not drink alcohol or use drugs.  Past Medical History:  Diagnosis Date  . Anxiety   . Endometriosis   . Hormone disorder   . Infertility, female   . Kidney stones   . Mitral valve prolapse     Past Surgical History:  Procedure Laterality Date  . CESAREAN SECTION    . CYSTOSCOPY W/ URETERAL STENT PLACEMENT Left 01/16/2017   Procedure:  CYSTOSCOPY WITH RETROGRADE PYELOGRAM/URETEROSCOPY/ LASER LITHOTRIPSY/URETERAL STENT PLACEMENT;  Surgeon: Heloise PurpuraLester Borden, MD;  Location: [facility];  Service: Urology;  Laterality: Left;  . Laparoscopic for endometriosis     uterus, ovary, and intestine    Current Outpatient Medications  Medication Sig Dispense Refill  . Estradiol-Norethindrone Acet 0.5-0.1 MG tablet Take 1 tablet by mouth daily.    Marland Kitchen. FLUoxetine HCl (PROZAC PO) Take 40 mg by mouth daily.      No current facility-administered medications for this visit.     Family History  Problem Relation Age of Onset  . Hypertension Mother   . Anxiety disorder Mother   . Gallstones Sister   . Alzheimer's disease Maternal Grandmother   . Heart Problems Maternal Grandmother   . Stroke Maternal Grandmother   . Alzheimer's disease Maternal Grandfather   . Alzheimer's disease Paternal Grandmother   . Alzheimer's disease Paternal Grandfather     ROS:  Pertinent items are noted in HPI.  Otherwise, a comprehensive ROS was negative.  Exam:   BP 108/64 (BP Location: Right Arm, Patient Position: Sitting, Cuff Size: Large)   Pulse 72   Resp 16   Ht 5\' 9"  (1.753 m)   Wt 215 lb (97.5 kg)   LMP 10/07/2017 (Within Days)   BMI 31.75 kg/m     General appearance: alert, cooperative and appears  stated age Head: Normocephalic, without obvious abnormality, atraumatic Neck: no adenopathy, supple, symmetrical, trachea midline and thyroid normal to inspection and palpation Lungs: clear to auscultation bilaterally Breasts: normal appearance, no masses or tenderness, No nipple retraction or dimpling, No nipple discharge or bleeding, No axillary or supraclavicular adenopathy Heart: regular rate and rhythm Abdomen: soft, non-tender; no masses, no organomegaly Extremities: extremities normal, atraumatic, no cyanosis or edema Skin: Skin color, texture, turgor normal. No rashes or lesions Lymph nodes: Cervical, supraclavicular, and axillary nodes  normal. No abnormal inguinal nodes palpated Neurologic: Grossly normal  Pelvic: External genitalia:  no lesions              Urethra:  normal appearing urethra with no masses, tenderness or lesions              Bartholins and Skenes: normal                 Vagina: normal appearing vagina with normal color and discharge, no lesions              Cervix: no lesions              Pap taken: Yes.   Bimanual Exam:  Uterus:  normal size, contour, position, consistency, mobility, non-tender              Adnexa: no mass, fullness, tenderness              Rectal exam: Yes.  .  Confirms.              Anus:  normal sphincter tone, no lesions  Chaperone was present for exam.  Assessment:   Well woman visit with normal exam. Perimenopause.  On LopreezA HRT (Activella). Anxiety. On Prozac. Fatigue.  Plan: Mammogram screening discussed.  Mammogram yearly recommended. Recommended self breast awareness. Pap and HR HPV as above. Guidelines for Calcium, Vitamin D, regular exercise program including cardiovascular and weight bearing exercise. Check FSH, TSH, CBC, testosterone.  IFOB. Return in 2 weeks.  Will get old records. Brochure on perimenopause given.    Follow up annually and prn.   After visit summary provided.

## 2018-01-25 NOTE — Patient Instructions (Signed)

## 2018-01-26 LAB — CYTOLOGY - PAP
DIAGNOSIS: NEGATIVE
HPV (WINDOPATH): NOT DETECTED

## 2018-01-30 LAB — CBC
HEMOGLOBIN: 14.9 g/dL (ref 11.1–15.9)
Hematocrit: 44.7 % (ref 34.0–46.6)
MCH: 28.3 pg (ref 26.6–33.0)
MCHC: 33.3 g/dL (ref 31.5–35.7)
MCV: 85 fL (ref 79–97)
PLATELETS: 310 10*3/uL (ref 150–379)
RBC: 5.27 x10E6/uL (ref 3.77–5.28)
RDW: 14.5 % (ref 12.3–15.4)
WBC: 8.2 10*3/uL (ref 3.4–10.8)

## 2018-01-30 LAB — TSH: TSH: 1.32 u[IU]/mL (ref 0.450–4.500)

## 2018-01-30 LAB — TESTOSTERONE, FREE, DIRECT
TESTOSTERONE FREE: 1.7 pg/mL (ref 0.0–4.2)
TESTOSTERONE, TOTAL: 18.4 ng/dL

## 2018-01-30 LAB — FOLLICLE STIMULATING HORMONE: FSH: 27.4 m[IU]/mL

## 2018-02-08 ENCOUNTER — Ambulatory Visit: Payer: BLUE CROSS/BLUE SHIELD | Admitting: Obstetrics and Gynecology

## 2018-02-09 ENCOUNTER — Ambulatory Visit: Payer: BLUE CROSS/BLUE SHIELD | Admitting: Obstetrics and Gynecology

## 2018-02-15 ENCOUNTER — Telehealth: Payer: Self-pay | Admitting: Obstetrics and Gynecology

## 2018-02-15 ENCOUNTER — Other Ambulatory Visit: Payer: Self-pay

## 2018-02-15 ENCOUNTER — Encounter: Payer: Self-pay | Admitting: Obstetrics and Gynecology

## 2018-02-15 ENCOUNTER — Ambulatory Visit (INDEPENDENT_AMBULATORY_CARE_PROVIDER_SITE_OTHER): Payer: BLUE CROSS/BLUE SHIELD | Admitting: Obstetrics and Gynecology

## 2018-02-15 VITALS — BP 122/70 | HR 88 | Resp 16 | Ht 69.0 in | Wt 218.0 lb

## 2018-02-15 DIAGNOSIS — R6882 Decreased libido: Secondary | ICD-10-CM | POA: Insufficient documentation

## 2018-02-15 DIAGNOSIS — N951 Menopausal and female climacteric states: Secondary | ICD-10-CM | POA: Diagnosis not present

## 2018-02-15 MED ORDER — NONFORMULARY OR COMPOUNDED ITEM: 0 refills | Status: DC

## 2018-02-15 NOTE — Progress Notes (Signed)
GYNECOLOGY  VISIT   HPI: 47 y.o.   Married  [place]  female   G1P0001 with Patient's last menstrual period was 02/15/2018.   here for 2 week recheck and review of labs.  Decreased libido is her primary concern.   Having perimenopausal symptoms.  EMB 09/2017 from Dr. Mitzi Hansen - benign proliferative endometrium.  [place] 02/2016 1.7 cm subserosal fibroid.  FSH 24 08/30/17.  Normal TSH 08/30/17.  Menses returned 02/15/18.  On Lopreeza, Activella. Hot flashes controlled.  Had bilateral leg pain when she took Yaz. No DVT.  Usually had menstrual headaches.    Used hormone free IUD in past.   Exercising more.  Feeling better.   GYNECOLOGIC HISTORY: Patient's last menstrual period was 02/15/2018. Contraception:  Lopreeza Menopausal hormone therapy:  none Last mammogram:   04/25/17 - [facility] BI-RADS2. Last pap smear:   01/25/18 Pap and HR HPV negative        OB History    Gravida  1   Para  1   Term  0   Preterm  0   AB  0   Living  1     SAB  0   TAB  0   Ectopic  0   Multiple  0   Live Births  0              Patient Active Problem List   Diagnosis Date Noted  . Perimenopausal vasomotor symptoms 01/25/2018    Past Medical History:  Diagnosis Date  . Anxiety   . Endometriosis   . Hormone disorder   . Infertility, female   . Kidney stones   . Mitral valve prolapse     Past Surgical History:  Procedure Laterality Date  . CESAREAN SECTION    . CYSTOSCOPY W/ URETERAL STENT PLACEMENT Left 01/16/2017   Procedure: CYSTOSCOPY WITH RETROGRADE PYELOGRAM/URETEROSCOPY/ LASER LITHOTRIPSY/URETERAL STENT PLACEMENT;  Surgeon: Heloise Purpura, MD;  Location: [facility];  Service: Urology;  Laterality: Left;  . Laparoscopic for endometriosis     uterus, ovary, and intestine    Current Outpatient Medications  Medication Sig Dispense Refill  . cholecalciferol (VITAMIN D) 1000 units tablet Take 1,000 Units by mouth daily.    . Estradiol-Norethindrone Acet 0.5-0.1  MG tablet Take 1 tablet by mouth daily.    Marland Kitchen FLUoxetine HCl (PROZAC PO) Take 40 mg by mouth daily.     . Probiotic Product (PROBIOTIC DAILY PO) Take by mouth daily.     No current facility-administered medications for this visit.      ALLERGIES: Patient has no known allergies.  Family History  Problem Relation Age of Onset  . Hypertension Mother   . Anxiety disorder Mother   . Gallstones Sister   . Alzheimer's disease Maternal Grandmother   . Heart Problems Maternal Grandmother   . Stroke Maternal Grandmother   . Alzheimer's disease Maternal Grandfather   . Alzheimer's disease Paternal Grandmother   . Alzheimer's disease Paternal Grandfather     Social History   Socioeconomic History  . Marital status: Married    Spouse name: Not on file  . Number of children: Not on file  . Years of education: Not on file  . Highest education level: Not on file  Occupational History  . Not on file  Social Needs  . Financial resource strain: Not on file  . Food insecurity:    Worry: Not on file    Inability: Not on file  . Transportation needs:    Medical:  Not on file    Non-medical: Not on file  Tobacco Use  . Smoking status: Never Smoker  . Smokeless tobacco: Never Used  Substance and Sexual Activity  . Alcohol use: No  . Drug use: No  . Sexual activity: Yes    Birth control/protection: Pill  Lifestyle  . Physical activity:    Days per week: Not on file    Minutes per session: Not on file  . Stress: Not on file  Relationships  . Social connections:    Talks on phone: Not on file    Gets together: Not on file    Attends religious service: Not on file    Active member of club or organization: Not on file    Attends meetings of clubs or organizations: Not on file    Relationship status: Not on file  . Intimate partner violence:    Fear of current or ex partner: Not on file    Emotionally abused: Not on file    Physically abused: Not on file    Forced sexual activity:  Not on file  Other Topics Concern  . Not on file  Social History Narrative  . Not on file    ROS:  Pertinent items are noted in HPI.  PHYSICAL EXAMINATION:    BP 122/70 (BP Location: Right Arm, Patient Position: Sitting, Cuff Size: Normal)   Pulse 88   Resp 16   Ht 5\' 9"  (1.753 m)   Wt 218 lb (98.9 kg)   LMP 02/15/2018   BMI 32.19 kg/m     General appearance: alert, cooperative and appears stated age   Labs from 01/25/18: Free testosterone 1.7. FSH 27.4. TSH 1.320.  ASSESSMENT  Decreased libido.  Perimenopause.   Current menses. On Activella. Anxiety.  On Prozac.  PLAN  We talked about fluctuating hormonal levels in perimenopause. She declines switching from Activella to LoLoestrin due to her decreased libido and LE pain with use of Yaz. We will start treating her low libido with compounded testosterone cream.  She will return for a recheck and lab work in 6 weeks. We talked about Prozac and the effect on libido but she will not make a change at this time.   An After Visit Summary was printed and given to the patient.  _25_____ minutes face to face time of which over 50% was spent in counseling.

## 2018-02-15 NOTE — Telephone Encounter (Signed)
Reviewed with Dr. Edward JollySilva, call returned to [facility], spoke with Asher MuirJamie. Confirmed petrolatum base for testosterone propionate.   Routing to provider for final review. Will close encounter.

## 2018-02-15 NOTE — Telephone Encounter (Signed)
[facility] called wanting to verify the prescription as creme or petro base.

## 2018-03-07 ENCOUNTER — Telehealth: Payer: Self-pay | Admitting: Obstetrics and Gynecology

## 2018-03-07 NOTE — Telephone Encounter (Signed)
Left message to call Machaela Caterino at [phone].  

## 2018-03-07 NOTE — Telephone Encounter (Signed)
Patient has some questions about her testerone prescription.

## 2018-03-08 NOTE — Telephone Encounter (Signed)
Spoke with patient. Asking where to apply Testosterone cream? Advised patient to apply to inner thigh.  Routing to provider for final review. Patient is agreeable to disposition. Will close encounter.

## 2018-03-08 NOTE — Telephone Encounter (Signed)
Patient left message stating that she has questions about her prescription.

## 2018-03-15 ENCOUNTER — Encounter: Payer: Self-pay | Admitting: Obstetrics and Gynecology

## 2018-03-15 ENCOUNTER — Telehealth: Payer: Self-pay | Admitting: Obstetrics and Gynecology

## 2018-03-15 NOTE — Telephone Encounter (Signed)
Left message to call Christy Mitchell at [phone].  Call placed to [facility] , spoke with [place]. Was advised no restrictions for sun with compounded testosterone propionate, can go into pool/water/shower 2 hrs after applying.

## 2018-03-15 NOTE — Telephone Encounter (Signed)
Patient sent the following correspondence through MyChart. Routing to triage to assist patient with request.  ----- Message from Mychart, Generic sent at 03/15/2018 8:50 AM EDT -----    Good morning Dr. Debbe Bales,    Just to clarify, the testosterone cream should be applied to the inner thigh or behind de knees? There is a problem if go to the sun and pool right after.?    Have a great day.  Thank you,   Christy Mitchell

## 2018-03-16 NOTE — Telephone Encounter (Signed)
No return call, see MyChart message to patient with pharmacy instructions.   Routing to provider, will close encounter.

## 2018-03-19 ENCOUNTER — Telehealth: Payer: Self-pay | Admitting: Obstetrics and Gynecology

## 2018-03-19 NOTE — Telephone Encounter (Signed)
Patient sent the following correspondence through MyChart. Routing to provider for FYI only. Closing encounter.  ----- Message from Mychart, Generic sent at 03/16/2018 5:19 PM EDT -----    Thanks!   HGW,   Patrcia     ----- Message -----  From: Nurse Sherilyn Cooter  Sent: 03/16/18, 15:51  To: Shelda Pal  Subject: Medication Instructions    Mrs. Stegemann,    Good afternoon. Testosterone cream should be applied to inner thigh, no restrictions for going into the sun. You should wait two hours after applying cream before showering or entering the pool, this allows the cream to absorb. Have a great afternoon!    Noreene Larsson, RN

## 2018-03-20 ENCOUNTER — Telehealth: Payer: Self-pay | Admitting: Obstetrics and Gynecology

## 2018-03-20 NOTE — Telephone Encounter (Signed)
Please contact patient regarding her IFOB which has not been received to date.  If not returned to the office within one week, order will be cancelled.  

## 2018-03-22 NOTE — Telephone Encounter (Signed)
Tried calling patient, no answer, left message for patient to call me back.  

## 2018-03-28 ENCOUNTER — Ambulatory Visit: Payer: BLUE CROSS/BLUE SHIELD | Admitting: Obstetrics and Gynecology

## 2018-04-11 ENCOUNTER — Other Ambulatory Visit: Payer: Self-pay | Admitting: Obstetrics and Gynecology

## 2018-04-11 ENCOUNTER — Ambulatory Visit: Payer: BLUE CROSS/BLUE SHIELD | Admitting: Obstetrics and Gynecology

## 2018-04-11 ENCOUNTER — Other Ambulatory Visit: Payer: Self-pay

## 2018-04-11 ENCOUNTER — Encounter: Payer: Self-pay | Admitting: Obstetrics and Gynecology

## 2018-04-11 VITALS — BP 100/64 | HR 68 | Resp 16 | Ht 69.0 in | Wt 206.0 lb

## 2018-04-11 DIAGNOSIS — Z1231 Encounter for screening mammogram for malignant neoplasm of breast: Secondary | ICD-10-CM

## 2018-04-11 DIAGNOSIS — Z79890 Hormone replacement therapy (postmenopausal): Secondary | ICD-10-CM | POA: Diagnosis not present

## 2018-04-11 DIAGNOSIS — R6882 Decreased libido: Secondary | ICD-10-CM | POA: Diagnosis not present

## 2018-04-11 MED ORDER — ESTRADIOL-NORETHINDRONE ACET 0.5-0.1 MG PO TABS: 1.0000 | ORAL_TABLET | Freq: Every day | ORAL | 1 refills | Status: DC

## 2018-04-11 NOTE — Progress Notes (Signed)
Patient scheduled while in office for screening MMG at [facility] on 05/04/18 arriving at 9am for 9:20am appointment . Patient verbalizes understanding and is agreeable.

## 2018-04-11 NOTE — Progress Notes (Signed)
GYNECOLOGY  VISIT   HPI: 47 y.o.   Married  [place]  female   G1P0001 with Patient's last menstrual period was 02/15/2018 (exact date).   here for f/u testosterone use. Also needs refill of Lopreeza.  Started testosterone replacement of low libido.  Energy and libido is improved.  Using bid about the size of a pea, on the inner thighs.  A little bit of acne.  No increased hair growth.  Some vaginal dryness.  A little bit of hot flashes.   Is modifying her exercise.   She is now on Activella and off of Yaz due to decreased libido and LE pain with its use.   She is also on Prozac.    GYNECOLOGIC HISTORY: Patient's last menstrual period was 02/15/2018 (exact date). Contraception:  Menopausal hormone therapy:  none Last mammogram:  04/25/17 - [facility] BI-RADS2. Last pap smear:   01/25/18 Pap and HR HPV negative        OB History    Gravida  1   Para  1   Term  0   Preterm  0   AB  0   Living  1     SAB  0   TAB  0   Ectopic  0   Multiple  0   Live Births  0              Patient Active Problem List   Diagnosis Date Noted  . Decreased libido 02/15/2018  . Perimenopausal vasomotor symptoms 01/25/2018    Past Medical History:  Diagnosis Date  . Anxiety   . Endometriosis   . Hormone disorder   . Infertility, female   . Kidney stones   . Mitral valve prolapse     Past Surgical History:  Procedure Laterality Date  . CESAREAN SECTION    . CYSTOSCOPY W/ URETERAL STENT PLACEMENT Left 01/16/2017   Procedure: CYSTOSCOPY WITH RETROGRADE PYELOGRAM/URETEROSCOPY/ LASER LITHOTRIPSY/URETERAL STENT PLACEMENT;  Surgeon: Heloise Purpura, MD;  Location: [facility];  Service: Urology;  Laterality: Left;  . Laparoscopic for endometriosis     uterus, ovary, and intestine    Current Outpatient Medications  Medication Sig Dispense Refill  . cholecalciferol (VITAMIN D) 1000 units tablet Take 1,000 Units by mouth daily.    . Estradiol-Norethindrone Acet 0.5-0.1 MG  tablet Take 1 tablet by mouth daily. 3 tablet 1  . FLUoxetine HCl (PROZAC PO) Take 40 mg by mouth daily.     . NONFORMULARY OR COMPOUNDED ITEM Testosterone propionate 2% in white petrolatum, apply bid for 6 weeks and then daily as directed.  60 grams. 60 each 0  . Probiotic Product (PROBIOTIC DAILY PO) Take by mouth daily.     No current facility-administered medications for this visit.      ALLERGIES: Patient has no known allergies.  Family History  Problem Relation Age of Onset  . Hypertension Mother   . Anxiety disorder Mother   . Gallstones Sister   . Alzheimer's disease Maternal Grandmother   . Heart Problems Maternal Grandmother   . Stroke Maternal Grandmother   . Alzheimer's disease Maternal Grandfather   . Alzheimer's disease Paternal Grandmother   . Alzheimer's disease Paternal Grandfather     Social History   Socioeconomic History  . Marital status: Married    Spouse name: Not on file  . Number of children: Not on file  . Years of education: Not on file  . Highest education level: Not on file  Occupational History  .  Not on file  Social Needs  . Financial resource strain: Not on file  . Food insecurity:    Worry: Not on file    Inability: Not on file  . Transportation needs:    Medical: Not on file    Non-medical: Not on file  Tobacco Use  . Smoking status: Never Smoker  . Smokeless tobacco: Never Used  Substance and Sexual Activity  . Alcohol use: No  . Drug use: No  . Sexual activity: Yes    Partners: Male  Lifestyle  . Physical activity:    Days per week: Not on file    Minutes per session: Not on file  . Stress: Not on file  Relationships  . Social connections:    Talks on phone: Not on file    Gets together: Not on file    Attends religious service: Not on file    Active member of club or organization: Not on file    Attends meetings of clubs or organizations: Not on file    Relationship status: Not on file  . Intimate partner violence:     Fear of current or ex partner: Not on file    Emotionally abused: Not on file    Physically abused: Not on file    Forced sexual activity: Not on file  Other Topics Concern  . Not on file  Social History Narrative  . Not on file    Review of Systems  Constitutional: Negative.   HENT: Negative.   Eyes: Negative.   Respiratory: Negative.   Cardiovascular: Negative.   Gastrointestinal: Negative.   Endocrine: Positive for heat intolerance.  Genitourinary: Negative.   Musculoskeletal: Negative.   Skin: Negative.   Allergic/Immunologic: Negative.   Neurological: Negative.   Hematological: Negative.   Psychiatric/Behavioral: Negative.     PHYSICAL EXAMINATION:    BP 100/64   Pulse 68   Resp 16   Ht 5\' 9"  (1.753 m)   Wt 206 lb (93.4 kg)   LMP 02/15/2018 (Exact Date)   BMI 30.42 kg/m     General appearance: alert, cooperative and appears stated age   ASSESSMENT  Decreased libido.  Testosterone therapy working well. Perimenopause.   Vaginal atrophy.  On Activella (Lopreeza). Anxiety.  On Prozac.   PLAN  Mammogram screening.  We will schedule.  Lopreeza 90 day with 1 refill.  We discussed risks of DTV, PE, MI, stroke, and breast cancer.  Continue testosterone.  Will check testosterone levels today.  We discussed cooking oils for vaginal dryness.  If this does not work well, will consider vaginal estrogen replacement.    An After Visit Summary was printed and given to the patient.  __15____ minutes face to face time of which over 50% was spent in counseling.

## 2018-04-15 LAB — TESTOSTERONE, FREE, DIRECT
Testosterone, Free: 3.1 pg/mL (ref 0.0–4.2)
Testosterone, total: 37.8 ng/dL

## 2018-04-15 NOTE — Addendum Note (Signed)
Addended by: Ardell IsaacsAMUNDSON C SILVA, Sherriann Szuch E on: 04/15/2018 12:49 PM   Modules accepted: Orders

## 2018-04-26 ENCOUNTER — Telehealth: Payer: Self-pay | Admitting: Obstetrics and Gynecology

## 2018-04-26 NOTE — Telephone Encounter (Signed)
[facility]

## 2018-05-02 NOTE — Telephone Encounter (Signed)
Reached back out to patient to follow up. Patient stated that she was supposed to make a 6 week follow up appointment for a recheck. Scheduled appointment for Aug 5.

## 2018-05-04 ENCOUNTER — Ambulatory Visit
Admission: RE | Admit: 2018-05-04 | Discharge: 2018-05-04 | Disposition: A | Discharge status: Home / Self Care | Payer: BLUE CROSS/BLUE SHIELD | Source: Ambulatory Visit | Attending: Obstetrics and Gynecology | Admitting: Obstetrics and Gynecology

## 2018-05-04 DIAGNOSIS — Z1231 Encounter for screening mammogram for malignant neoplasm of breast: Secondary | ICD-10-CM

## 2018-05-08 ENCOUNTER — Encounter: Payer: Self-pay | Admitting: Obstetrics and Gynecology

## 2018-05-08 ENCOUNTER — Other Ambulatory Visit: Payer: Self-pay | Admitting: Obstetrics and Gynecology

## 2018-05-08 MED ORDER — ESTRADIOL-NORETHINDRONE ACET 0.5-0.1 MG PO TABS: 1.0000 | ORAL_TABLET | Freq: Every day | ORAL | 2 refills | Status: DC

## 2018-05-09 ENCOUNTER — Telehealth: Payer: Self-pay | Admitting: *Deleted

## 2018-05-09 MED ORDER — ESTRADIOL-NORETHINDRONE ACET 0.5-0.1 MG PO TABS: 1.0000 | ORAL_TABLET | Freq: Every day | ORAL | 0 refills | Status: DC

## 2018-05-09 NOTE — Telephone Encounter (Signed)
My Chart message from patient:  ----- Message from Mychart, Generic sent at 05/09/2018 3:37 PM EDT -----    HI Dr. Debbe BalesBrook,   The [facility]. [facility] to [facility] [address], [place]. I already whithout medicine.     Thanks,   Restaurant manager, [employer] foodat[employer]ia Lim

## 2018-05-09 NOTE — Telephone Encounter (Signed)
Call to patient. Advised Rx sent to mail order pharmacy and appears confirmed. Discussed new 90 day Rx may not be covered by insurance locally.  Offered option of 30 day Rx to local pharmacy, (may have to pay for this) until mail order arrives and to ease stress of travel. Patient pleased with this option. Requests brand Lopreeza only.  Routing to provider for final review. . Will close encounter.

## 2018-06-11 ENCOUNTER — Telehealth: Payer: Self-pay | Admitting: Obstetrics and Gynecology

## 2018-06-11 ENCOUNTER — Ambulatory Visit: Payer: BLUE CROSS/BLUE SHIELD | Admitting: Obstetrics and Gynecology

## 2018-06-11 NOTE — Telephone Encounter (Signed)
Patient came in for her appointment this morning with Dr. Edward JollySilva. She said she did not listen to the voice mails this morning letting her know her appointment had been cancelled due to the provider being out of the office. She said, "It's okay, this works better for me anyway. I've got some things to do with my daughter." Patient requested to reschedule to after school starts and scheduled for 07/13/18 at 10:00 AM. She said she is "doing fine" and will call with any concerns. Routing to provider for FYI only.

## 2018-06-14 ENCOUNTER — Other Ambulatory Visit: Payer: Self-pay | Admitting: Obstetrics and Gynecology

## 2018-06-14 ENCOUNTER — Encounter: Payer: Self-pay | Admitting: Obstetrics and Gynecology

## 2018-06-14 MED ORDER — ESTRADIOL-NORETHINDRONE ACET 0.5-0.1 MG PO TABS: 1.0000 | ORAL_TABLET | Freq: Every day | ORAL | 0 refills | Status: DC

## 2018-06-14 NOTE — Telephone Encounter (Signed)
Medication refill request: Lopreeza Last AEX:  01/15/2018 Next AEX: 01/30/2019 Last MMG (if hormonal medication request): n/a Refill authorized: #[id number], 3 refills, please advise

## 2018-06-14 NOTE — Telephone Encounter (Signed)
Patient sent the following correspondence through MyChart. Routing to Refill Pool to assist patient with request.  Hi Dr. Debbe BalesBrook,   [place] return from [place] and [facility] my hormone lopreeza low dose and is not fill there. Please, I need this urgent because I am without medicine since last month. Let me know today!    Tks,   Christy Mitchell

## 2018-06-19 ENCOUNTER — Telehealth: Payer: Self-pay | Admitting: Obstetrics and Gynecology

## 2018-06-19 NOTE — Telephone Encounter (Signed)
Prescription was [facility] to [facility] 06/14/[facility]. Spoke with rep at [facility] that states that prescription was received on 06/14/18 and then voided due to unavailability. Per pharmacist, patient can switch prescription to different pharmacy or send an alternative prescription. Call to patient made, per patient okay to transfer prescription to [facility] [address] [place]. [facility] and patient has been notified. Patient agreeable to disposition. Will close encounter.

## 2018-06-19 NOTE — Telephone Encounter (Signed)
Patient is still waiting to hear about her hormone medication refill.. See MyChart message from 06/14/18.

## 2018-07-12 NOTE — Progress Notes (Signed)
GYNECOLOGY  VISIT   HPI: 47 y.o.   Married  Caucasian  female   G1P0001 with Patient's last menstrual period was 05/20/2018 (approximate).   here for medication management.   On Lopreeza and using testosterone therapy.  Ran out of Lopreeza and ended up taking 1/2 tab of the higher dosage she had at home.  This did not work well.  Now back up to regular HRT medication and taking a genetic from a local pharmacy.  Stopped taking testosterone.   Has menses about every 2 months.   Not sleeping as well.  Taking melatonin and Sleep Aide.   Lot of responsibility now.  Taking Prozac.  Delayed orgasm when she recently received a generic substitution.   Exercising regularly.   GYNECOLOGIC HISTORY: Patient's last menstrual period was 05/20/2018 (approximate). Contraception:  menopausal Menopausal hormone therapy:  lopreeza Last mammogram:  05/04/2018 BI-RADS CATEGORY  1: Negative. Last pap smear:   01/25/2018        OB History    Gravida  1   Para  1   Term  0   Preterm  0   AB  0   Living  1     SAB  0   TAB  0   Ectopic  0   Multiple  0   Live Births  0              Patient Active Problem List   Diagnosis Date Noted  . Decreased libido 02/15/2018  . Perimenopausal vasomotor symptoms 01/25/2018  . Overweight 01/12/2017  . Mixed emotional features as adjustment reaction 01/10/2017  . Recurrent nephrolithiasis 12/17/2015    Past Medical History:  Diagnosis Date  . Anxiety   . Endometriosis   . Hormone disorder   . Infertility, female   . Kidney stones   . Mitral valve prolapse     Past Surgical History:  Procedure Laterality Date  . CESAREAN SECTION    . CYSTOSCOPY W/ URETERAL STENT PLACEMENT Left 01/16/2017   Procedure: CYSTOSCOPY WITH RETROGRADE PYELOGRAM/URETEROSCOPY/ LASER LITHOTRIPSY/URETERAL STENT PLACEMENT;  Surgeon: Heloise Purpura, MD;  Location: [facility];  Service: Urology;  Laterality: Left;  . Laparoscopic for endometriosis     uterus,  ovary, and intestine    Current Outpatient Medications  Medication Sig Dispense Refill  . cholecalciferol (VITAMIN D) 1000 units tablet Take 1,000 Units by mouth daily.    . Estradiol-Norethindrone Acet (LOPREEZA) 0.5-0.1 MG tablet Take 1 tablet by mouth daily. 90 tablet 2  . FLUoxetine HCl (PROZAC PO) Take 40 mg by mouth daily.     . NONFORMULARY OR COMPOUNDED ITEM Testosterone propionate 2% in white petrolatum, apply bid for 6 weeks and then daily as directed.  60 grams. 60 each 0  . Probiotic Product (PROBIOTIC DAILY PO) Take by mouth daily.     No current facility-administered medications for this visit.      ALLERGIES: Patient has no known allergies.  Family History  Problem Relation Age of Onset  . Hypertension Mother   . Anxiety disorder Mother   . Gallstones Sister   . Alzheimer's disease Maternal Grandmother   . Heart Problems Maternal Grandmother   . Stroke Maternal Grandmother   . Alzheimer's disease Maternal Grandfather   . Alzheimer's disease Paternal Grandmother   . Alzheimer's disease Paternal Grandfather     Social History   Socioeconomic History  . Marital status: Married    Spouse name: Not on file  . Number of children: Not on file  .  Years of education: Not on file  . Highest education level: Not on file  Occupational History  . Not on file  Social Needs  . Financial resource strain: Not on file  . Food insecurity:    Worry: Not on file    Inability: Not on file  . Transportation needs:    Medical: Not on file    Non-medical: Not on file  Tobacco Use  . Smoking status: Never Smoker  . Smokeless tobacco: Never Used  Substance and Sexual Activity  . Alcohol use: No  . Drug use: No  . Sexual activity: Yes    Partners: Male  Lifestyle  . Physical activity:    Days per week: Not on file    Minutes per session: Not on file  . Stress: Not on file  Relationships  . Social connections:    Talks on phone: Not on file    Gets together: Not on  file    Attends religious service: Not on file    Active member of club or organization: Not on file    Attends meetings of clubs or organizations: Not on file    Relationship status: Not on file  . Intimate partner violence:    Fear of current or ex partner: Not on file    Emotionally abused: Not on file    Physically abused: Not on file    Forced sexual activity: Not on file  Other Topics Concern  . Not on file  Social History Narrative  . Not on file    Review of Systems  Constitutional: Negative.   HENT: Negative.   Eyes: Negative.   Respiratory: Negative.   Cardiovascular: Negative.   Gastrointestinal: Negative.   Endocrine: Negative.   Genitourinary: Negative.   Musculoskeletal: Negative.   Skin: Negative.   Allergic/Immunologic: Negative.   Neurological: Negative.   Hematological: Negative.   Psychiatric/Behavioral: Negative.   All other systems reviewed and are negative.   PHYSICAL EXAMINATION:    BP 100/72   Pulse 93   Resp 14   Ht 5\' 9"  (1.753 m)   Wt 208 lb (94.3 kg)   LMP 05/20/2018 (Approximate)   BMI 30.72 kg/m     General appearance: alert, cooperative and appears stated age  ASSESSMENT  HRT.  Perimenopausal.  Sleep disturbance.  Decreased orgasm response.  On Prozac.  Off testosterone.   PLAN  Refill of Lopreeza 90 day and 2 refills.  We talked about the WHI study and risk and benefits of HRT.  She is prematurely perimenopausal and has benefits from taking this including improved quality of life.  We talked about the effect of fluexetine on sexual functioning.  She may want to ask for name brand Prozac.  FU for annual exam and prn.   An After Visit Summary was printed and given to the patient.  ___25___ minutes face to face time of which over 50% was spent in counseling.

## 2018-07-13 ENCOUNTER — Ambulatory Visit: Payer: BLUE CROSS/BLUE SHIELD | Admitting: Obstetrics and Gynecology

## 2018-07-13 ENCOUNTER — Other Ambulatory Visit: Payer: Self-pay

## 2018-07-13 ENCOUNTER — Encounter: Payer: Self-pay | Admitting: Obstetrics and Gynecology

## 2018-07-13 VITALS — BP 100/72 | HR 93 | Resp 14 | Ht 69.0 in | Wt 208.0 lb

## 2018-07-13 DIAGNOSIS — Z79890 Hormone replacement therapy (postmenopausal): Secondary | ICD-10-CM | POA: Diagnosis not present

## 2018-07-13 MED ORDER — ESTRADIOL-NORETHINDRONE ACET 0.5-0.1 MG PO TABS: 1.0000 | ORAL_TABLET | Freq: Every day | ORAL | 2 refills | Status: DC

## 2018-08-02 ENCOUNTER — Telehealth: Payer: Self-pay | Admitting: Obstetrics and Gynecology

## 2018-08-02 ENCOUNTER — Encounter: Payer: Self-pay | Admitting: Obstetrics and Gynecology

## 2018-08-02 NOTE — Telephone Encounter (Signed)
Left message to call Naod Sweetland at [phone].  

## 2018-08-02 NOTE — Telephone Encounter (Signed)
Hi Dr. Debbe Bales, I am having a lot of pimples in my neck and face and also ingrown hair. I think this generic hormones is affecting me badly. We can try another option no generic?  Tks,   PS

## 2018-08-03 ENCOUNTER — Telehealth: Payer: Self-pay | Admitting: Obstetrics and Gynecology

## 2018-08-03 MED ORDER — ACTIVELLA 1-0.5 MG PO TABS: 1.0000 | ORAL_TABLET | Freq: Every day | ORAL | 0 refills | Status: DC

## 2018-08-03 MED ORDER — ACTIVELLA 0.5-0.1 MG PO TABS: 1.0000 | ORAL_TABLET | Freq: Every day | ORAL | 2 refills | Status: DC

## 2018-08-03 NOTE — Telephone Encounter (Signed)
[facility] Activella, DAW, brand only.   Call returned to patient, left detailed message, advised as seen above, F/u with [facility] for filling.   Call placed to [facility]. Spoke with [place]. RX cancelled for Lopreeza.   Encounter closed.

## 2018-08-03 NOTE — Telephone Encounter (Signed)
Spoke with patient. Patient reports increased acne on face and neck, and ingrown hairs on labia since starting generic HRT. Patient states Lopreeza no longer available, requesting alternative RX. Patient requesting brand only to [facility] on file.   Denies any other GYN symptoms.  Advised will review with Dr. Edward Jolly and return call. Patient agreeable.   Dr. Edward Jolly -please advise on alternative Rx.

## 2018-08-03 NOTE — Telephone Encounter (Signed)
Patient returning Jill's call.  °

## 2018-08-03 NOTE — Telephone Encounter (Signed)
She can take Activella 0.5/0.1 mg po q day.  This is the name brand of Lopreeza.

## 2018-08-03 NOTE — Telephone Encounter (Signed)
Patient states she is returning Christy Mitchell's call. No open note.

## 2018-08-03 NOTE — Telephone Encounter (Signed)
Patient calling back regarding prescription at local [facility]. Wants to begin medication today and needs sent to local pharmacy as well as mail order. Advised will send 30 days only to local [facility].   Routing to provider for review. Will close encounter.

## 2018-08-09 ENCOUNTER — Telehealth: Payer: Self-pay | Admitting: Obstetrics and Gynecology

## 2018-08-09 ENCOUNTER — Encounter: Payer: Self-pay | Admitting: Obstetrics and Gynecology

## 2018-08-09 NOTE — Telephone Encounter (Signed)
Patient returning Tracy's call. °

## 2018-08-09 NOTE — Telephone Encounter (Signed)
[facility]. Requested coverage review. 40 mins on hold. No answer. Formulary information found online.  Patient tried Lopreeza, did well, but discontinued manufacturing.  Tried generic Activella and caused allergic reaction: Skin ulcers.  Brand activella not covered.  Alternatives: Jinteli, Mimvey (Tier 1)  Premphase, Prempro (Tier 3).   Dr. Edward Jolly, will any of the alternatives meet her needs?

## 2018-08-09 NOTE — Telephone Encounter (Signed)
Patient is returning a call to Tracy. °

## 2018-08-09 NOTE — Telephone Encounter (Signed)
Christy Mitchell will be the closest to what she was taking with the Lopreeza, but the dosages are not exactly the same.  I would suggest trying Christy Mitchell for at least 3 months.

## 2018-08-09 NOTE — Telephone Encounter (Signed)
Patient sent the following correspondence through MyChart. Routing to triage to assist patient with request.  Hi Dr. Debbe Bales, I received the new prescription activella but is suuuuper expensive. There is another option between no generic and generic?   I was using before Lopreesa, was good for me but the factory stop working this brand. There is another brand like lopreesa with the same componentes as them.     Please give some help because I am without hormone since the generic was not good for me.

## 2018-08-10 MED ORDER — MIMVEY 1-0.5 MG PO TABS: 1.0000 | ORAL_TABLET | Freq: Every day | ORAL | 1 refills | Status: DC

## 2018-08-10 MED ORDER — MIMVEY 1-0.5 MG PO TABS: 1.0000 | ORAL_TABLET | Freq: Every day | ORAL | 0 refills | Status: DC

## 2018-08-10 NOTE — Telephone Encounter (Signed)
Pt agreeable. Will try Mimvey. Cost should be lower than Activella.  One month sent to local pharmacy. Will call back with any concerns.  If does well with one month, Mimvey will need to be sent to [facility] for long term Rx. [facility] with request to [facility].

## 2018-08-10 NOTE — Telephone Encounter (Signed)
Patient returned call to [place] during lunch and left a message to call her back.

## 2018-08-10 NOTE — Telephone Encounter (Signed)
Message left to return call to Jayde Mcallister at [phone].    

## 2018-08-14 ENCOUNTER — Telehealth: Payer: Self-pay

## 2018-08-14 NOTE — Telephone Encounter (Signed)
Spoke with patient and confirmed that mimvey was ok .

## 2018-08-14 NOTE — Telephone Encounter (Signed)
Called patient because of in coming fax. About her Lopreeza. [facility] is currently out of the medication. Need to know if she wants to try to the generic Mimvey or if she wants to wait until [facility] has it in stock or if she would like to the medication called to a different pharmacy.

## 2018-08-20 ENCOUNTER — Encounter: Payer: Self-pay | Admitting: Obstetrics and Gynecology

## 2018-08-21 ENCOUNTER — Telehealth: Payer: Self-pay | Admitting: Obstetrics and Gynecology

## 2018-08-21 NOTE — Telephone Encounter (Signed)
Message   Hi Dr. Debbe Bales,     I would like book appointment as soon as possible to check my hormones. Since I stoped that generic hormones, probably a month ago I am have had horrible pimples and now all around my face, neck and chest. Also I had for the last week nausea, constipation, vomiting and headache. Is it possible the hormones caused this problem? I went to the urgent care 2 times in 1 week and last week I check with my primary doctor who did blood test and ct scan of my abdomen. Suggest me book with gastroenterologist if the symptoms persists.   Also the [facility] still not send to me the new hormone because apparently is missing instruction from your prescription. So I am totally a mess and looking for answers. Please call me! Thx, PS.

## 2018-08-21 NOTE — Telephone Encounter (Signed)
Responded to patient via mychart. Patient was to pick up Mimvey for 30 days from [facility] to try before getting long term rx [facility].   Asked patient to call for office visit.

## 2018-08-21 NOTE — Telephone Encounter (Signed)
Patient called office. She is asking if she should have hormone testing done. Reviewed the mychart message she sent in to Dr. Edward Jolly.  Advised her GI symptoms do not correlate with her short term use of generic Activella.  Encouraged her to seek follow up with GI if her symptoms persist as she was instructed to do.  Offered to schedule office visit with Dr. Edward Jolly, pt declines.  She states she will try the new prescription Mimvey and will see how she feels. She will call back with an update in two weeks so we will know to send the prescription to [facility].   Pt agreeable to plan.  Routing final message to Dr. Edward Jolly and will close encounter as patient declines office visit and states she will call back with follow up.

## 2018-08-21 NOTE — Telephone Encounter (Signed)
Patient sent the following correspondence through MyChart. Routing to triage to assist patient with request.  Mayo Ao, I can pick up from [facility] the mimvey to try. Please sent to them. Also I gonna call to book an appointment. Thx, PS    ----- Message -----  From: Nurse Enrique Sack  Sent: 08/21/18, 08:13  To: Christy Hashimoto Da Orbie Hurst Imburgia  Subject: RE: Non-Urgent Medical Question    [name],   This is French Ana, Dr. Rica Records Triage Nurse, I have been speaking with you regarding your prescriptions.   Were you able [facility]? You were going to try that for 30 days before we [facility].     I can schedule you for an appointment with Dr. Edward Jolly, can you please call the office directly and one of the receptionists or the nurses can schedule you to see Dr. Edward Jolly.   Sincerely,   Almedia Balls, RN     ----- Message -----   From: Christy Mitchell   Sent: 08/20/2018 10:04 PM EDT    To: Melony Overly, MD  Subject: Non-Urgent Medical Question    Hi Dr. Debbe Bales,     I would like book appointment as soon as possible to check my hormones. Since I stoped that generic hormones, probably a month ago I am have had horrible pimples and now all around my face, neck and chest. Also I had for the last week nausea, constipation, vomiting and headache. Is it possible the hormones caused this problem? I went to the urgent care 2 times in 1 week and last week I check with my primary doctor who did blood test and ct scan of my abdomen. Suggest me book with gastroenterologist if the symptoms persists.   Also the [facility] still not send to me the new hormone because apparently is missing instruction from your prescription. So I am totally a mess and looking for answers. Please call me! Thx, PS.

## 2018-08-21 NOTE — Telephone Encounter (Signed)
Responded to patient via mychart.  Christy Mitchell has been [facility]. Called and confirmed prescription has been processed.

## 2018-08-28 ENCOUNTER — Telehealth: Payer: Self-pay | Admitting: Obstetrics and Gynecology

## 2018-08-28 NOTE — Telephone Encounter (Signed)
Patient sent the following correspondence through MyChart. Routing to triage to assist patient with request.  Mayo Ao, I started last week with Mimvey and I didn't see any difference in my skin and hot flashes. How long do you think I need wait to see some significant difference? Thank You, Elease Hashimoto.  ----- Message -----  From: Nurse Enrique Sack  Sent: 08/21/18, 12:03  To: Elease Hashimoto Da Orbie Hurst Blake  Subject: RE: Non-Urgent Medical Question    Randie Heinz! We look forward to hearing from you. I sent the Mimvey for you on 08/10/18 to [facility] in [facility]. If there is any problems at the pharmacy please let us know.   Thank you,   French Ana     ----- Message -----   From: Elease Hashimoto Da Orbie Hurst Mcnamee   Sent: 08/21/2018 11:45 AM EDT    To: Melony Overly, MD  Subject: RE: Non-Urgent Medical Question    Mayo Ao, [facility] mimvey to try. Please sent to them. Also I gonna call to book an appointment. Thx, PS    ----- Message -----  From: Nurse Enrique Sack  Sent: 08/21/18, 08:13  To: Elease Hashimoto Da Orbie Hurst Strothman  Subject: RE: Non-Urgent Medical Question    Ms. Silva,   This is French Ana, Dr. Rica Records Triage Nurse, I have been speaking with you regarding your prescriptions.   Were you [facility]? You were going to try that for 30 days before we [facility].     I can schedule you for an appointment with Dr. Edward Jolly, can you please call the office directly and one of the receptionists or the nurses can schedule you to see Dr. Edward Jolly.   Sincerely,   Almedia Balls, RN     ----- Message -----   From: Elease Hashimoto Da Orbie Hurst Spieker   Sent: 08/20/2018 10:04 PM EDT    To: Melony Overly, MD  Subject: Non-Urgent Medical Question    Hi Dr. Debbe Bales,     I would like book appointment as soon as possible to check my hormones. Since I stoped that generic hormones, probably a month ago I am have had  horrible pimples and now all around my face, neck and chest. Also I had for the last week nausea, constipation, vomiting and headache. Is it possible the hormones caused this problem? I went to the urgent care 2 times in 1 week and last week I check with my primary doctor who did blood test and ct scan of my abdomen. Suggest me book with gastroenterologist if the symptoms persists.   Also the [facility] still not send to me the new hormone because apparently is missing instruction from your prescription. So I am totally a mess and looking for answers. Please call me! Thx, PS.

## 2018-08-29 NOTE — Telephone Encounter (Signed)
Message left to return call to Christy Mitchell at [phone].    

## 2018-08-29 NOTE — Telephone Encounter (Signed)
Patient returned call to Tracy. °

## 2018-09-04 MED ORDER — MIMVEY 1-0.5 MG PO TABS: 1.0000 | ORAL_TABLET | Freq: Every day | ORAL | 0 refills | Status: DC

## 2018-09-04 NOTE — Telephone Encounter (Signed)
Spoke with patient. She states she continues to have some hot flashes and acne. Feels "a little bit" better. Started cycle yesterday. This is normal for her. No missed pills.   Discussed need for 3 months on new pills, Mimvey. She has made a lot of changes to hormones and had some time off of hormones. Needs time for body to adjust to new hormones. Reviewed Dr. Courtney Paris previous notes.  Pt agreeable to this plan. Wants to fill one month locally, does not want a 3 month prescription sent to [facility].  One month refill sent to [facility].  Patient advised to call back if any concerns.  Routing to Dr. Edward Jolly and will close encounter.

## 2018-09-17 ENCOUNTER — Encounter: Payer: Self-pay | Admitting: Obstetrics and Gynecology

## 2018-09-17 ENCOUNTER — Telehealth: Payer: Self-pay | Admitting: Obstetrics and Gynecology

## 2018-09-17 NOTE — Telephone Encounter (Signed)
Patient sent the following correspondence through MyChart. Routing to triage to assist patient with request.  Hi Dr. Debbe Bales,   I think this Christy Mitchell is not work. I bleeding for second time in this month and the first box is not finish yet. Please, let me know what to do. Thx, PS.

## 2018-09-17 NOTE — Telephone Encounter (Signed)
Responded to patient via mychart.  Pt needs office visit with Dr. Edward Jolly.   Mrs. Dunson,  I am sorry this is not working for you. Can you please call the office and speak with one of the nurses at your convenience. I think you need to come in to see Dr. Edward Jolly at this time and she will want to see you as well.  Please call [phone] so we can schedule a time that will work for both you and Dr. Edward Jolly.  Thank you,  French Ana

## 2018-09-18 NOTE — Telephone Encounter (Signed)
Call to patient.  She states she is having vaginal bleeding while taking Mimvey. She is currently on her first pack and confirms this. Patient expresses hesitancy to accept an appointment with Dr. Edward Jolly. I advised that since she is having irregular bleeding and unable to tolerate HRT changes, office visit necessary at this time. Last office visit with Dr. Edward Jolly 07/13/2018. Has not tolerated any HRT other than Loprezza. Perimenopausal.  Denies heavy bleeding. States changes pad q 3 hours.  Not dizzy or weak. Has not missed any pills of Mimvey.  Patient states she has decided to stop taking Mimvey and feels "this is not the right pill for her."   Office visit with Dr. Edward Jolly scheduled for 09/26/2018.  She is advised to call back with any increase in bleeding or any concerns prior to appointment.   Patient agreeable to plan as scheduled and understands instructions.   Routing to Dr. Edward Jolly to review. Okay to close?

## 2018-09-19 NOTE — Telephone Encounter (Signed)
Noted message from Dr. Edward JollySilva. Pt is scheduled for office visit.  Has DC Mimvey on her own.  Will close encounter.

## 2018-09-19 NOTE — Telephone Encounter (Signed)
I agree with an office visit.  She may need a pelvic ultrasound and endometrial biopsy. I do recommend blood work to recheck her hormonal levels.  It is helpful for her to stop all hormonal treatment so I can get a good assessment with her blood work.

## 2018-09-24 ENCOUNTER — Telehealth: Payer: Self-pay | Admitting: Obstetrics and Gynecology

## 2018-09-24 NOTE — Telephone Encounter (Signed)
Patient called and left a message requesting a call back from the nurse, French Anaracy. No other details left on voice mail.

## 2018-09-24 NOTE — Telephone Encounter (Signed)
Spoke with patient. Patient states she her vaginal bleeding stopped 11/13, so she never stopped Mimvey. Patient does not want to stop HRT, request to cancel OV scheduled for 11/20. Encounregaed patient to keep OV as scheduled, patient declines. Denies any other GYN symptoms or pain.   Patient will calendar bleeding and return call if irregular bleeding continues or with any new symptoms.   Advised I will update Dr. Edward JollySilva and return call if any additional recommendations.   Routing to provider for final review. Patient is agreeable to disposition. Will close encounter.

## 2018-09-26 ENCOUNTER — Ambulatory Visit: Payer: Self-pay | Admitting: Obstetrics and Gynecology

## 2018-10-11 ENCOUNTER — Telehealth: Payer: Self-pay | Admitting: Obstetrics and Gynecology

## 2018-10-11 NOTE — Telephone Encounter (Signed)
Patient is had decided to continue Mimvey and would refills sent to [facility].

## 2018-10-12 NOTE — Telephone Encounter (Signed)
Patient has called requesting refill for Mimvey. See telephone encounter dated 09-24-18. Please advise. Does patient need OV?

## 2018-10-12 NOTE — Telephone Encounter (Signed)
Return call to patient. Left message to call back to triage nurse.    

## 2018-10-12 NOTE — Telephone Encounter (Signed)
I would like to ask the patient to come in for a recheck appointment.  I need to have a better understanding of her hormonal problems.  There have been a lot of electronic messages and phone calls about different side effects and symptoms, and I can help her best if she comes in for a visit.   Cc- Billie RuddySally Yeakley

## 2018-10-16 ENCOUNTER — Other Ambulatory Visit: Payer: Self-pay | Admitting: Obstetrics and Gynecology

## 2018-10-16 MED ORDER — MIMVEY 1-0.5 MG PO TABS: 1.0000 | ORAL_TABLET | Freq: Every day | ORAL | 0 refills | Status: DC

## 2018-10-16 NOTE — Telephone Encounter (Signed)
Medication refill request: Mimvey  Last AEX:  01/25/18  Next AEX: 01/30/19 Last MMG (if hormonal medication request): 05/04/18 Bi-rads 1 Neg  Refill authorized: #[id number] with 1 RF

## 2018-10-16 NOTE — Telephone Encounter (Signed)
Patient requesting refill of Mimvey be sent to [facility] [place] at [address]. Requests 3 month supply. Only 3 pills left.

## 2018-10-16 NOTE — Telephone Encounter (Signed)
Spoke with patient, advised as seen below per Dr. Edward JollySilva. Patient states she has been taking Mimvey for 2 months, never stopped mimvey.   OV scheduled for 10/22/18 at 12:30 pm. Patient states she has 1 Mimvey tab left. [facility]. Advised patient will need to keep OV as scheduled for further refills.   Routing to provider for final review. Patient is agreeable to disposition. Will close encounter.

## 2018-10-16 NOTE — Telephone Encounter (Signed)
Call to patient. Left [facility] to [facility] or [facility]age nurse. See phone note from 10-11-18 regarding My Chart message and patient bleeding issues.

## 2018-10-16 NOTE — Telephone Encounter (Signed)
I do recommend a follow up visit with me. I can refill Mimvey for one month.  I would really like to be able to help her and it is difficult to follow her progress with her care due to several messages and prescription requests.

## 2018-10-16 NOTE — Telephone Encounter (Signed)
Call to patient, per ROI can leave message on voice mail but phone number not listed. Voice mail confirms phone number listed in Epic. Left message to [facility] and /or triage nurse.

## 2018-10-16 NOTE — Telephone Encounter (Signed)
Patient returned call

## 2018-10-17 NOTE — Telephone Encounter (Signed)
See refill encounter dated 10/16/18. 1 month Mimvey sent to pharmacy. OV scheduled for 10/22/18 with Dr. Edward JollySilva.   Encounter closed.

## 2018-10-19 ENCOUNTER — Telehealth: Payer: Self-pay | Admitting: Obstetrics and Gynecology

## 2018-10-19 ENCOUNTER — Encounter: Payer: Self-pay | Admitting: Obstetrics and Gynecology

## 2018-10-19 MED ORDER — MIMVEY 1-0.5 MG PO TABS: 1.0000 | ORAL_TABLET | Freq: Every day | ORAL | 0 refills | Status: DC

## 2018-10-19 NOTE — Telephone Encounter (Signed)
Patient is without hormone medication today. Appointment scheduled for 10/22/18. States she is having issues with the prescription being called in for 30 days instead of 90, as well. Patient states they will not apply the discount to the 30 day supply, but pharmacy denied 90 day.

## 2018-10-19 NOTE — Telephone Encounter (Signed)
Patient called. See telephone encounter dated 10/19/18.

## 2018-10-19 NOTE — Telephone Encounter (Signed)
Call reviewed with Dr. Edward JollySilva. OK to send Mimvey 1-0.5 mg tab #90/0RF due to cost difference. Patient needs OV as scheduled for further assessment and evaluation. Medication recommendations may change.     Call returned to patient, advised as seen above, Rx for mimvey 1-0.5mg  #90/0RF to verified pharmacy. Patient states medication is working well, no changes to be made. Advised patient to keep OV as scheduled for further discussion with Dr. Edward JollySilva. Patient verbalizes understanding.   [facility] Rx for Mimvey 1-0.5 mg tab #30/0RF cancelled.    Routing to provider for final review. Patient is agreeable to disposition. Will close encounter.

## 2018-10-19 NOTE — Telephone Encounter (Signed)
Spoke with patient. Patient requesting 90 day supply of Mimvey. Patient states her out of pocket cost for 30 day supply of medication is $72, 90 day supply is $10. Patient states she is without her medication and will start bleeding without it. Advised patient 30 day supply sent to pharmacy, OV recommended with Dr. Edward JollySilva for further discussion of medication, med changes may be recommended. Patient states "no changes will be made to my medication, I make changes, not Dr. Edward JollySilva". Patient has OV scheduled for 10/22/18. Patient is requesting Dr. Edward JollySilva review. Advised Dr. Edward JollySilva will review. I will return call.   Dr. Edward JollySilva -please advise.

## 2018-10-22 ENCOUNTER — Encounter: Payer: Self-pay | Admitting: Obstetrics and Gynecology

## 2018-10-22 ENCOUNTER — Other Ambulatory Visit: Payer: Self-pay

## 2018-10-22 ENCOUNTER — Ambulatory Visit: Payer: BLUE CROSS/BLUE SHIELD | Admitting: Obstetrics and Gynecology

## 2018-10-22 VITALS — BP 122/78 | Ht 69.0 in | Wt 217.4 lb

## 2018-10-22 DIAGNOSIS — Z79890 Hormone replacement therapy (postmenopausal): Secondary | ICD-10-CM | POA: Diagnosis not present

## 2018-10-22 NOTE — Progress Notes (Signed)
GYNECOLOGY  VISIT   HPI: 47 y.o.   Married  Caucasian  female   G1P0001 with Patient's last menstrual period was 07/16/2018 (approximate).   here for   HRT Check.  On Mimvey and likes it.   Consultation recommended to review her progress due to multiple different types of electronic messages received over the last several months and the challenges of putting her medical history and progress in a fashion that can be followed well. I assured patient that the visit is in the interest to provide her good quality care.   Patient has a hx of perimenopause and has been treated with multiple regimens and in different countries due to her international moves.   FSH 27.4 on 01/25/18. Testosterone 37.8 and free testosterone 3.1 on 04/11/18.  With the Mimvey, she bled the first month twice.  Second month bled less and less days.  Third month, bleeding stopped.   In general is menstruating every 3 months.  Menses normal in September, dark blood, not prolonged.   Hot flashes controlled. Insomnia no change.  No headaches.   Weight has changed compared to her prior HRT.   Mood improved.  Takes Prozac.   Not using testosterone at all.    Would like to have a child still.  Has done infertility consultation but has a low AMH.  Declines oocyte donation.   Not able to use low dose COCs due to leg pain and HA.   GYNECOLOGIC HISTORY: Patient's last menstrual period was 07/16/2018 (approximate). Contraception:  None.  Menopausal hormone therapy:  Mimvy  Last mammogram:  05/04/2018 BI-RADS CATEGORY 1: Negative. Last pap smear:  01-25-18 Neg:Neg HR HPV        OB History    Gravida  1   Para  1   Term  0   Preterm  0   AB  0   Living  1     SAB  0   TAB  0   Ectopic  0   Multiple  0   Live Births  0              Patient Active Problem List   Diagnosis Date Noted  . Decreased libido 02/15/2018  . Perimenopausal vasomotor symptoms 01/25/2018  . Overweight 01/12/2017  .  Mixed emotional features as adjustment reaction 01/10/2017  . Recurrent nephrolithiasis 12/17/2015    Past Medical History:  Diagnosis Date  . Anxiety   . Endometriosis   . Hormone disorder   . Infertility, female   . Kidney stones   . Mitral valve prolapse     Past Surgical History:  Procedure Laterality Date  . CESAREAN SECTION    . CYSTOSCOPY W/ URETERAL STENT PLACEMENT Left 01/16/2017   Procedure: CYSTOSCOPY WITH RETROGRADE PYELOGRAM/URETEROSCOPY/ LASER LITHOTRIPSY/URETERAL STENT PLACEMENT;  Surgeon: Heloise Purpura, MD;  Location: [facility];  Service: Urology;  Laterality: Left;  . Laparoscopic for endometriosis     uterus, ovary, and intestine    Current Outpatient Medications  Medication Sig Dispense Refill  . cholecalciferol (VITAMIN D) 1000 units tablet Take 1,000 Units by mouth daily.    Marland Kitchen FLUoxetine HCl (PROZAC PO) Take 40 mg by mouth daily.     Marland Kitchen MIMVEY 1-0.5 MG tablet Take 1 tablet by mouth daily. 90 tablet 0  . NONFORMULARY OR COMPOUNDED ITEM Testosterone propionate 2% in white petrolatum, apply bid for 6 weeks and then daily as directed.  60 grams. 60 each 0  . Probiotic Product (PROBIOTIC DAILY PO)  Take by mouth daily.     No current facility-administered medications for this visit.      ALLERGIES: Patient has no known allergies.  Family History  Problem Relation Age of Onset  . Hypertension Mother   . Anxiety disorder Mother   . Gallstones Sister   . Alzheimer's disease Maternal Grandmother   . Heart Problems Maternal Grandmother   . Stroke Maternal Grandmother   . Alzheimer's disease Maternal Grandfather   . Alzheimer's disease Paternal Grandmother   . Alzheimer's disease Paternal Grandfather     Social History   Socioeconomic History  . Marital status: Married    Spouse name: Not on file  . Number of children: Not on file  . Years of education: Not on file  . Highest education level: Not on file  Occupational History  . Not on file  Social Needs   . Financial resource strain: Not on file  . Food insecurity:    Worry: Not on file    Inability: Not on file  . Transportation needs:    Medical: Not on file    Non-medical: Not on file  Tobacco Use  . Smoking status: Never Smoker  . Smokeless tobacco: Never Used  Substance and Sexual Activity  . Alcohol use: No  . Drug use: No  . Sexual activity: Yes    Partners: Male  Lifestyle  . Physical activity:    Days per week: Not on file    Minutes per session: Not on file  . Stress: Not on file  Relationships  . Social connections:    Talks on phone: Not on file    Gets together: Not on file    Attends religious service: Not on file    Active member of club or organization: Not on file    Attends meetings of clubs or organizations: Not on file    Relationship status: Not on file  . Intimate partner violence:    Fear of current or ex partner: Not on file    Emotionally abused: Not on file    Physically abused: Not on file    Forced sexual activity: Not on file  Other Topics Concern  . Not on file  Social History Narrative  . Not on file    Review of Systems  All other systems reviewed and are negative.   PHYSICAL EXAMINATION:    BP 122/78 (BP Location: Right Arm, Patient Position: Sitting, Cuff Size: Large)   Ht 5\' 9"  (1.753 m)   Wt 217 lb 6.4 oz (98.6 kg)   LMP 07/16/2018 (Approximate)   BMI 32.10 kg/m     General appearance: alert, cooperative and appears stated age  ASSESSMENT  HRT.  Doing well on Mimvey. Perimenopausal female.  Unable to tolerate low dose COCs.  PLAN  We discussed continuation of Mimvey versus a Mirena IUD combined with estrogen therapy.  She is doing well with her current HRT and ultimately would accept pregnancy, so she will continue with Mimvey.  If she develops irregular menses or regular monthly menses, she will return for re-evaluation.  FU for annual examination.    An After Visit Summary was printed and given to the  patient.  _25_____ minutes face to face time of which over 50% was spent in counseling.

## 2019-01-14 ENCOUNTER — Encounter: Payer: Self-pay | Admitting: Obstetrics and Gynecology

## 2019-01-14 ENCOUNTER — Ambulatory Visit: Payer: BLUE CROSS/BLUE SHIELD | Admitting: Obstetrics and Gynecology

## 2019-01-14 ENCOUNTER — Other Ambulatory Visit: Payer: Self-pay

## 2019-01-14 VITALS — BP 110/64 | HR 76 | Ht 69.0 in | Wt 214.6 lb

## 2019-01-14 DIAGNOSIS — Z79890 Hormone replacement therapy (postmenopausal): Secondary | ICD-10-CM | POA: Diagnosis not present

## 2019-01-14 DIAGNOSIS — N951 Menopausal and female climacteric states: Secondary | ICD-10-CM

## 2019-01-14 MED ORDER — PROGESTERONE MICRONIZED 200 MG PO CAPS: 200.0000 mg | ORAL_CAPSULE | Freq: Every day | ORAL | 1 refills | Status: DC

## 2019-01-14 MED ORDER — ESTRADIOL 0.1 MG/24HR TD PTTW: 1.0000 | MEDICATED_PATCH | TRANSDERMAL | 1 refills | Status: DC

## 2019-01-14 NOTE — Progress Notes (Signed)
GYNECOLOGY  VISIT   HPI: 48 y.o.   Married  Caucasian  female   G1P0001 with Patient's last menstrual period was 07/12/2018 (exact date).   here to discuss HRT. Patient complaining of hot flashes, acne and insomnia.   Having vaginal dryness.   No further refills of Mimvy.  Has just a few pills left.  Annual exam scheduled for April 2020.  She does not tolerate COCs due to leg pain.   GYNECOLOGIC HISTORY: Patient's last menstrual period was 07/12/2018 (exact date). Contraception: None Menopausal hormone therapy: Mimvy Last mammogram: 06/28/2019BI-RADS CATEGORY 1: Negative. Last pap smear:   01-25-18 Neg:Neg HR HPV        OB History    Gravida  1   Para  1   Term  0   Preterm  0   AB  0   Living  1     SAB  0   TAB  0   Ectopic  0   Multiple  0   Live Births  0              Patient Active Problem List   Diagnosis Date Noted  . Decreased libido 02/15/2018  . Perimenopausal vasomotor symptoms 01/25/2018  . Overweight 01/12/2017  . Mixed emotional features as adjustment reaction 01/10/2017  . Recurrent nephrolithiasis 12/17/2015    Past Medical History:  Diagnosis Date  . Anxiety   . Endometriosis   . Hormone disorder   . Infertility, female   . Kidney stones   . Mitral valve prolapse     Past Surgical History:  Procedure Laterality Date  . CESAREAN SECTION    . CYSTOSCOPY W/ URETERAL STENT PLACEMENT Left 01/16/2017   Procedure: CYSTOSCOPY WITH RETROGRADE PYELOGRAM/URETEROSCOPY/ LASER LITHOTRIPSY/URETERAL STENT PLACEMENT;  Surgeon: Heloise Purpura, MD;  Location: [facility];  Service: Urology;  Laterality: Left;  . Laparoscopic for endometriosis     uterus, ovary, and intestine    Current Outpatient Medications  Medication Sig Dispense Refill  . cholecalciferol (VITAMIN D) 1000 units tablet Take 1,000 Units by mouth daily.    Marland Kitchen FLUoxetine HCl (PROZAC PO) Take 40 mg by mouth daily.     . Melatonin 10 MG CAPS Take 1 tablet by mouth at bedtime.     Marland Kitchen MIMVEY 1-0.5 MG tablet Take 1 tablet by mouth daily. 90 tablet 0   No current facility-administered medications for this visit.      ALLERGIES: Patient has no known allergies.  Family History  Problem Relation Age of Onset  . Hypertension Mother   . Anxiety disorder Mother   . Gallstones Sister   . Alzheimer's disease Maternal Grandmother   . Heart Problems Maternal Grandmother   . Stroke Maternal Grandmother   . Alzheimer's disease Maternal Grandfather   . Alzheimer's disease Paternal Grandmother   . Alzheimer's disease Paternal Grandfather     Social History   Socioeconomic History  . Marital status: Married    Spouse name: Not on file  . Number of children: Not on file  . Years of education: Not on file  . Highest education level: Not on file  Occupational History  . Not on file  Social Needs  . Financial resource strain: Not on file  . Food insecurity:    Worry: Not on file    Inability: Not on file  . Transportation needs:    Medical: Not on file    Non-medical: Not on file  Tobacco Use  . Smoking status: Never Smoker  .  Smokeless tobacco: Never Used  Substance and Sexual Activity  . Alcohol use: No  . Drug use: No  . Sexual activity: Yes    Partners: Male  Lifestyle  . Physical activity:    Days per week: Not on file    Minutes per session: Not on file  . Stress: Not on file  Relationships  . Social connections:    Talks on phone: Not on file    Gets together: Not on file    Attends religious service: Not on file    Active member of club or organization: Not on file    Attends meetings of clubs or organizations: Not on file    Relationship status: Not on file  . Intimate partner violence:    Fear of current or ex partner: Not on file    Emotionally abused: Not on file    Physically abused: Not on file    Forced sexual activity: Not on file  Other Topics Concern  . Not on file  Social History Narrative  . Not on file    Review of Systems   All other systems reviewed and are negative.   PHYSICAL EXAMINATION:    BP 110/64 (BP Location: Right Arm, Patient Position: Sitting, Cuff Size: Large)   Pulse 76   Ht 5\' 9"  (1.753 m)   Wt 214 lb 9.6 oz (97.3 kg)   LMP 07/12/2018 (Exact Date)   BMI 31.69 kg/m     General appearance: alert, cooperative and appears stated age  Chaperone was present for exam.  ASSESSMENT  Perimenopausal female.   PLAN  We discussed the changes in hormone levels in perimenopause as an explanation for her symptoms that come and go versus a failure of her HRT. We reviewed the various forms of HRT.  Oral, transdermal, vaginal, intrauterine. We discussed in detail Mirena IUD and transdermal estrogen versus Prometrium and transdermal estrogen, risks and benefits.  Patient elects for the latter.  She will stop Mimvy. Will start Vivelle Dot 0.1 mg twice weekly and Prometrium 200 mg nightly Return for annual exam in April, 2020.    An After Visit Summary was printed and given to the patient.  _25____ minutes face to face time of which over 50% was spent in counseling.

## 2019-01-15 ENCOUNTER — Ambulatory Visit: Payer: BLUE CROSS/BLUE SHIELD | Admitting: Obstetrics and Gynecology

## 2019-01-30 ENCOUNTER — Ambulatory Visit: Payer: BLUE CROSS/BLUE SHIELD | Admitting: Obstetrics and Gynecology

## 2019-02-01 ENCOUNTER — Other Ambulatory Visit: Payer: Self-pay | Admitting: Obstetrics and Gynecology

## 2019-02-01 ENCOUNTER — Encounter: Payer: Self-pay | Admitting: Obstetrics and Gynecology

## 2019-02-01 MED ORDER — PROGESTERONE MICRONIZED 200 MG PO CAPS: 200.0000 mg | ORAL_CAPSULE | Freq: Every day | ORAL | 0 refills | Status: DC

## 2019-02-01 MED ORDER — ESTRADIOL 0.1 MG/24HR TD PTTW
1.0000 | MEDICATED_PATCH | TRANSDERMAL | 0 refills | Status: DC
Start: 2019-02-04 — End: 2020-06-25

## 2019-02-04 ENCOUNTER — Telehealth: Payer: Self-pay | Admitting: Obstetrics and Gynecology

## 2019-02-04 NOTE — Telephone Encounter (Signed)
Message   Also, could insert a message to the pharmacy or insurance that we are on try on this last month and that's why we didn't order 3 month supply before and now that we decided go ahead with this option we are included the two months remaining.   Thank you and have a great weekend as well.     ----- Message -----  From: Melony Overly, MD  Sent: 02/01/19, 16:17  To: Christy Hashimoto [facility]  Subject: RE: Non-Urgent Medical Question    [name],     I have just refilled your estrogen and progesterone for the next 2 months.     Have a good weekend!    Conley Simmonds      ----- Message -----   From: Christy Mitchell   Sent: 02/01/2019 2:36 PM EDT    To: Melony Overly, MD  Subject: RE: Non-Urgent Medical Question    Hi Dr. Debbe Bales, could you send the rest of all my new prescription (14month) estradiol adhesive and progesterone pills. I don't have more adhesive left and I need change next one on Monday. I think I didn't receive enough adhesive since my last visit (when we agree to try the new treatment) for one month only one box with 8 adhesives and is already finished.     By the way for the record I think we can keep this treatment for now, I am feeling well and sleep well.   Thank you,  Christy Mitchell

## 2019-02-06 NOTE — Telephone Encounter (Signed)
Spoke with patient. Patient was able to pick up rx. Only given 1 month at at time due to insurance. Aware she will have a refill for another month. Will need to call the pharmacy and request a refill at the end of the month. Patient verbalizes understanding. Encounter closed.

## 2019-02-06 NOTE — Telephone Encounter (Signed)
Left message to call [name] at [phone]. 

## 2019-02-08 ENCOUNTER — Encounter: Payer: Self-pay | Admitting: Obstetrics and Gynecology

## 2019-02-10 ENCOUNTER — Encounter: Payer: Self-pay | Admitting: Obstetrics and Gynecology

## 2019-02-11 ENCOUNTER — Telehealth: Payer: Self-pay | Admitting: Obstetrics and Gynecology

## 2019-02-11 NOTE — Telephone Encounter (Signed)
Spoke with patient.   1. Reports intermittent bleeding since 02/07/19. Changes pad approximately q4 hrs. Advised n ot uncommon to have bleeidng with change in hormone regimen. Recommended patient to continue to calendar bleeding and return call to office if bleeding continues.   2. Patient reports bilateral breast tenderness starting on 02/07/19. Denies any recent injuries, nipple d/c, lumps or fever/chills. Last MMG 05/04/18, neg. Advised patient Dr. Edward Jolly will review, our office will f/u with recommendations.   Dr. Edward Jolly -please advise.

## 2019-02-11 NOTE — Telephone Encounter (Signed)
Still intermittent bleeding since Thursday, is not little because I need use ob. Also I have breast pain, is it normal?  Thank you   Christy Mitchell

## 2019-02-12 NOTE — Telephone Encounter (Signed)
The patient's bleeding is likely due to the change in her hormone therapy regimen.  She is perimenopausal and still having menstrual cycles periodically also.  A menstrual cycle may also explain her breast tenderness.   If she continues to have periodic spotting, I would recommend a pelvic ultrasound and then potential consideration of a Mirena IUD combined with estrogen.   If she continues to have breast tenderness, I would recommend an office visit with me for a breast check.

## 2019-02-12 NOTE — Telephone Encounter (Signed)
Left message to call Trayton Szabo at [phone]. 

## 2019-02-12 NOTE — Telephone Encounter (Signed)
Patient is returning a call to [name]. °

## 2019-02-13 NOTE — Telephone Encounter (Signed)
Patient left voicemail returning call to [name].  °

## 2019-02-14 ENCOUNTER — Encounter: Payer: Self-pay | Admitting: Obstetrics and Gynecology

## 2019-02-14 ENCOUNTER — Telehealth: Payer: Self-pay | Admitting: Obstetrics and Gynecology

## 2019-02-14 NOTE — Telephone Encounter (Signed)
Left message to call Annamaria Salah at [phone]. 

## 2019-02-14 NOTE — Telephone Encounter (Signed)
Phone call with patient.   She sent a message through My Chart regarding breast tenderness, ongoing vaginal bleeding and a sensation in her leg.  She felt a tremor in a vein in her right leg.  When she is walking and exercising she does not feel it.  She notes it when she is at rest.  No increased heat.  No pain in the leg.  No specific swelling in the leg.    She is on HRT for perimenopausal symptoms and has had problems with multiple regimens.   Low risk of DVT.   Stop transdermal estrogen.   Take an ASA 81 mg.   Will have follow up visit in office tomorrow.

## 2019-02-15 ENCOUNTER — Ambulatory Visit: Payer: BLUE CROSS/BLUE SHIELD | Admitting: Obstetrics and Gynecology

## 2019-02-15 ENCOUNTER — Other Ambulatory Visit: Payer: Self-pay

## 2019-02-15 ENCOUNTER — Encounter: Payer: Self-pay | Admitting: Obstetrics and Gynecology

## 2019-02-15 VITALS — BP 102/70 | HR 68 | Temp 97.9°F | Resp 16 | Wt 212.0 lb

## 2019-02-15 DIAGNOSIS — Z79890 Hormone replacement therapy (postmenopausal): Secondary | ICD-10-CM | POA: Diagnosis not present

## 2019-02-15 DIAGNOSIS — N939 Abnormal uterine and vaginal bleeding, unspecified: Secondary | ICD-10-CM

## 2019-02-15 DIAGNOSIS — Z3202 Encounter for pregnancy test, result negative: Secondary | ICD-10-CM | POA: Diagnosis not present

## 2019-02-15 LAB — POCT URINE PREGNANCY: Preg Test, Ur: NEGATIVE

## 2019-02-15 NOTE — Progress Notes (Addendum)
GYNECOLOGY  VISIT   HPI: 48 y.o.   Married  Caucasian  female   G1P0001 with No LMP recorded. (Menstrual status: Perimenopausal).   here for bleeding & pulsating in leg.    Patient called in yesterday with above symptoms.  I asked her to take of her transdermal patch.  Had headache last night after she took off her transdermal estrogen.   Her leg is feeling better since she stopped her estrogen yesterday.  She states no pain in the leg, just felt something moving in the back of her right calf at night, the night before last.  No symptoms during the day.  No swelling. In the past, she had pain in her legs with she took COCs, so she stopped them with her prior GYN provider.   Having some vaginal bleeding that can be heavy for the last 8 days.  Pad change every 4 hours.  Minor cramping today.  This is the fist time in 6 months. She has been cycling every 3 months prior to this.   Taking Melatonin for sleep and another herbal remedy.    She has been on multiple HRT regimens and has had intolerances or difficulties controlling her symptoms.  FSH 27.4 on 01/25/18.  Took anticonvulsive as a child due to fever. Not as an adult.  UPT - negative.   GYNECOLOGIC HISTORY: No LMP recorded. (Menstrual status: Perimenopausal). Contraception:  none Menopausal hormone therapy:  Vivelle dot 0.1mg  patch & progesterone pill. Stopped both yesterday Last mammogram:  05-04-18 birads 1:neg Last pap smear:   01-25-18 neg HPV HR neg        OB History    Gravida  1   Para  1   Term  0   Preterm  0   AB  0   Living  1     SAB  0   TAB  0   Ectopic  0   Multiple  0   Live Births  0              Patient Active Problem List   Diagnosis Date Noted  . Decreased libido 02/15/2018  . Perimenopausal vasomotor symptoms 01/25/2018  . Overweight 01/12/2017  . Mixed emotional features as adjustment reaction 01/10/2017  . Recurrent nephrolithiasis 12/17/2015    Past Medical History:   Diagnosis Date  . Anxiety   . Endometriosis   . Hormone disorder   . Infertility, female   . Kidney stones   . Mitral valve prolapse     Past Surgical History:  Procedure Laterality Date  . CESAREAN SECTION    . CYSTOSCOPY W/ URETERAL STENT PLACEMENT Left 01/16/2017   Procedure: CYSTOSCOPY WITH RETROGRADE PYELOGRAM/URETEROSCOPY/ LASER LITHOTRIPSY/URETERAL STENT PLACEMENT;  Surgeon: Heloise Purpura, MD;  Location: [facility];  Service: Urology;  Laterality: Left;  . Laparoscopic for endometriosis     uterus, ovary, and intestine    Current Outpatient Medications  Medication Sig Dispense Refill  . cholecalciferol (VITAMIN D) 1000 units tablet Take 1,000 Units by mouth daily.    Marland Kitchen FLUoxetine HCl (PROZAC PO) Take 40 mg by mouth daily.     . Melatonin 10 MG CAPS Take 1 tablet by mouth at bedtime.    Marland Kitchen estradiol (VIVELLE-DOT) 0.1 MG/24HR patch Place 1 patch (0.1 mg total) onto the skin 2 (two) times a week. (Patient not taking: Reported on 02/15/2019) 16 patch 0  . progesterone (PROMETRIUM) 200 MG capsule Take 1 capsule (200 mg total) by mouth daily. (Patient not  taking: Reported on 02/15/2019) 60 capsule 0   No current facility-administered medications for this visit.      ALLERGIES: Patient has no known allergies.  Family History  Problem Relation Age of Onset  . Hypertension Mother   . Anxiety disorder Mother   . Gallstones Sister   . Alzheimer's disease Maternal Grandmother   . Heart Problems Maternal Grandmother   . Stroke Maternal Grandmother   . Alzheimer's disease Maternal Grandfather   . Alzheimer's disease Paternal Grandmother   . Alzheimer's disease Paternal Grandfather     Social History   Socioeconomic History  . Marital status: Married    Spouse name: Not on file  . Number of children: Not on file  . Years of education: Not on file  . Highest education level: Not on file  Occupational History  . Not on file  Social Needs  . Financial resource strain: Not on  file  . Food insecurity:    Worry: Not on file    Inability: Not on file  . Transportation needs:    Medical: Not on file    Non-medical: Not on file  Tobacco Use  . Smoking status: Never Smoker  . Smokeless tobacco: Never Used  Substance and Sexual Activity  . Alcohol use: No  . Drug use: No  . Sexual activity: Yes    Partners: Male  Lifestyle  . Physical activity:    Days per week: Not on file    Minutes per session: Not on file  . Stress: Not on file  Relationships  . Social connections:    Talks on phone: Not on file    Gets together: Not on file    Attends religious service: Not on file    Active member of club or organization: Not on file    Attends meetings of clubs or organizations: Not on file    Relationship status: Not on file  . Intimate partner violence:    Fear of current or ex partner: Not on file    Emotionally abused: Not on file    Physically abused: Not on file    Forced sexual activity: Not on file  Other Topics Concern  . Not on file  Social History Narrative  . Not on file    Review of Systems  Constitutional: Negative.   HENT: Negative.   Eyes: Negative.   Respiratory: Negative.   Cardiovascular: Negative.   Gastrointestinal: Negative.   Endocrine: Negative.   Genitourinary: Positive for vaginal bleeding.  Musculoskeletal:       Pulsating in right leg  Skin: Negative.   Allergic/Immunologic: Negative.   Neurological: Negative.   Hematological: Negative.   Psychiatric/Behavioral: Negative.     PHYSICAL EXAMINATION:    BP 102/70   Pulse 68   Temp 97.9 F (36.6 C) (Oral)   Resp 16   Wt 212 lb (96.2 kg)   BMI 31.31 kg/m     General appearance: alert, cooperative and appears stated age   Pelvic: External genitalia:  no lesions              Urethra:  normal appearing urethra with no masses, tenderness or lesions              Bartholins and Skenes: normal                 Vagina: normal appearing vagina with normal color and  discharge, no lesions  Cervix: no lesions.  Small clot at os.                 Bimanual Exam:  Uterus:  normal size, contour, position, consistency, mobility, non-tender              Adnexa: no mass, fullness, tenderness      Lower extremities:  No cords, negative for edema/erythema/calor, negative Homan's.      Chaperone was present for exam.  ASSESSMENT  Perimenopausal female.  Current menstruation.  Intolerance to estrogen? Doubt DVT.   PLAN  Stop HRT and will do observation of her symptoms.  If bleeding continues, will do pelvic US.   May consider Mirena IUD for cycle control if needed. We discussed Paxil to replace her Prozac to control anxiety and menopausal symptoms.  I also reviewed Gabapentin as an option for vaosomotor symptoms.    An After Visit Summary was printed and given to the patient.  _25_____ minutes face to face time of which over 50% was spent in counseling.

## 2019-02-15 NOTE — Telephone Encounter (Signed)
Patient seen for office visit today.  Will close this encounter.

## 2019-02-21 ENCOUNTER — Encounter: Payer: Self-pay | Admitting: Obstetrics and Gynecology

## 2019-02-21 ENCOUNTER — Telehealth: Payer: Self-pay | Admitting: Obstetrics and Gynecology

## 2019-02-21 DIAGNOSIS — N939 Abnormal uterine and vaginal bleeding, unspecified: Secondary | ICD-10-CM

## 2019-02-21 NOTE — Telephone Encounter (Signed)
Left message to call [name] at [phone]. 

## 2019-02-21 NOTE — Telephone Encounter (Signed)
Hi Dr. Debbe Bales, today complete third week bleeding. Is it normal?

## 2019-02-22 NOTE — Telephone Encounter (Signed)
Please schedule pelvic ultrasound and potential EMB with me for next week.

## 2019-02-22 NOTE — Telephone Encounter (Signed)
Spoke with patient. Advised of message as seen below from Dr.Silva. Patient verbalizes understanding. PUS and possible EMB scheduled for 02/28/2019 at 8:30 am with 9 am appointment with Dr.Silva. Patient is agreeable to date and time. Aware if her bleeding becomes heavy having to change her pad/tampon every hour for more than 2 hours or if she becomes light headed, dizzy, or weak needs to contact the office. Can be seen over the weekend or after hours at [facility] ED if needed for evaluation.  Routing to provider and will close encounter.

## 2019-02-22 NOTE — Telephone Encounter (Signed)
Patient returning call to [name]. °

## 2019-02-22 NOTE — Telephone Encounter (Signed)
Spoke with patient. Patient is calling to report that she is still having bleeding since her OV on 02/15/2019. States she had her menses on March 15 after 6 months with no menses. Usually has her menses every 3 months. Then started bleeding again around March 30. Patient was seen in the office on 02/15/2019 with Dr.Silva for evaluation. Patient's HRT was discontinued at that time. States she is changing a super tampon every 4 hours. Denies any cramping, feeling light headed, weak, or dizzy. Per Dr.Silva's note on 02/15/2019 patient will need further evaluation with PUS. Advised will review with Dr.Silva and return call.

## 2019-02-22 NOTE — Telephone Encounter (Signed)
Left message to call Yasira Engelson at [phone]. 

## 2019-02-27 ENCOUNTER — Encounter: Payer: Self-pay | Admitting: Obstetrics and Gynecology

## 2019-02-27 ENCOUNTER — Telehealth: Payer: Self-pay | Admitting: Obstetrics and Gynecology

## 2019-02-27 NOTE — Telephone Encounter (Signed)
Message   Also, Dr. Debbe Bales the bleeding reduce a lot compared from yesterday and today it is just a smudge that I notice only after peeing and cleaning myself. Do you think is mandatory to do this exame now or we can wait a little more?

## 2019-02-27 NOTE — Telephone Encounter (Signed)
MyChart message reviewed with Dr. Edward Jolly, call returned to patient. PUS with [facility] cancelled for 02/28/19. Patient will continue to monitor bleeding, is aware to return call if irregular bleeding continues, any new symptoms develop or with any other concerns. Patient verbalizes understanding and is agreeable.   Routing to provider for final review. Patient is agreeable to disposition. Will close encounter.

## 2019-02-28 ENCOUNTER — Other Ambulatory Visit: Payer: BLUE CROSS/BLUE SHIELD

## 2019-02-28 ENCOUNTER — Other Ambulatory Visit: Payer: BLUE CROSS/BLUE SHIELD | Admitting: Obstetrics and Gynecology

## 2019-03-01 ENCOUNTER — Ambulatory Visit: Payer: BLUE CROSS/BLUE SHIELD | Admitting: Obstetrics and Gynecology

## 2019-03-11 ENCOUNTER — Telehealth: Payer: Self-pay | Admitting: Obstetrics and Gynecology

## 2019-03-11 ENCOUNTER — Encounter: Payer: Self-pay | Admitting: Obstetrics and Gynecology

## 2019-03-11 NOTE — Telephone Encounter (Signed)
Hi Dr. Debbe Bales, some updates about my situation. During sexual intercourse I felt a lot of burning in the vagina, a lot of discomfort, is this normal due to all the medication I took and stopped? I am also having trouble to sleeping, could you recommend something to use aside melatonin 10mg . I am feeling some hot flashes, but they are still not strong enough to disturbe me. Also is there some medicine for woman libido since I am not using any hormone?    Thank you  Christy Mitchell

## 2019-03-11 NOTE — Telephone Encounter (Signed)
Routing to Dr. Silva to review and advise.  

## 2019-03-12 NOTE — Telephone Encounter (Signed)
Please let me know if she is still having any prolonged or frequent vaginal bleeding.  If she is still having bleeding problems, please have her see me next week for a pelvic ultrasound and potential endometrial biopsy.   If she is not having any bleeding abnormality, she can try vaginal Premarin cream 1/2 gram at hs nightly for 2 weeks and then 1/2 gram at hs 2 - 3 times weekly to help with vaginal dryness.  Please inform her that this is not a vaginal lubricant for intercourse.  It is a maintenance medication for treating vaginal atrophy, thinning of the vaginal tissue due to perimenopausal/menopausal thinning.  She will likely still need to use a water based lubricant or a cooking oil during intercourse.   Her decreased libido can be partially caused by the Prozac she is taking.  Switching to Wellbutrin XL may improve this.  She will need to discuss this with her provider currently giving her the Prozac.  If she starts Wellbutrin XL, I recommend taking it in the am as it can cause a sense of wakefulness.   Please has her ask her medical provider about medication to help her sleep as well.

## 2019-03-14 NOTE — Telephone Encounter (Signed)
Left message to call Nadalee Neiswender, RN at [facility] [phone].   

## 2019-03-20 ENCOUNTER — Encounter: Payer: Self-pay | Admitting: Obstetrics and Gynecology

## 2019-03-20 NOTE — Telephone Encounter (Signed)
Left message to call Linder Prajapati at [phone]. 

## 2019-03-21 MED ORDER — ESTROGENS, CONJUGATED 0.625 MG/GM VA CREA: TOPICAL_CREAM | VAGINAL | 2 refills | Status: DC

## 2019-03-21 NOTE — Telephone Encounter (Signed)
Patient sent the following message through MyChart. Routing to triage to assist patient with request.  Hi Katelyn, I receive your call and return twice but didn't find you since last Friday. Could you tell me here what I need to do, please? Thank you, PS.

## 2019-03-21 NOTE — Telephone Encounter (Signed)
Spoke with patient. Advised of message as seen below from Dr.Silva. Patient verbalizes understanding. Patient is no longer having any bleeding. Rx for Premarin vaginal cream 1/2 gram at hs nightly for 2 weeks and then 1/2 gram at hs 2 - 3 times weekly #30g 2RF sent to pharmacy on file. Patient will contact her PCP regarding decreased libido and trouble sleeping. Patient reports she is having 3 hot flashes a day. Feels these are manageable currently and does not want medication. Just wanted Dr.Silva to know in case they worsen.  Routing to provider and will close encounter.

## 2019-04-04 DIAGNOSIS — G4700 Insomnia, unspecified: Secondary | ICD-10-CM | POA: Insufficient documentation

## 2019-05-08 DIAGNOSIS — G5603 Carpal tunnel syndrome, bilateral upper limbs: Secondary | ICD-10-CM | POA: Insufficient documentation

## 2019-05-08 DIAGNOSIS — R200 Anesthesia of skin: Secondary | ICD-10-CM | POA: Insufficient documentation

## 2020-05-23 IMAGING — MG DIGITAL SCREENING BILATERAL MAMMOGRAM WITH TOMO AND CAD
8 series · 8 of 24 positions shown · non-contrast
Comparison: None.

CLINICAL DATA: Screening.

EXAM:
DIGITAL SCREENING BILATERAL MAMMOGRAM WITH TOMO AND CAD

[L CC synth-2D]
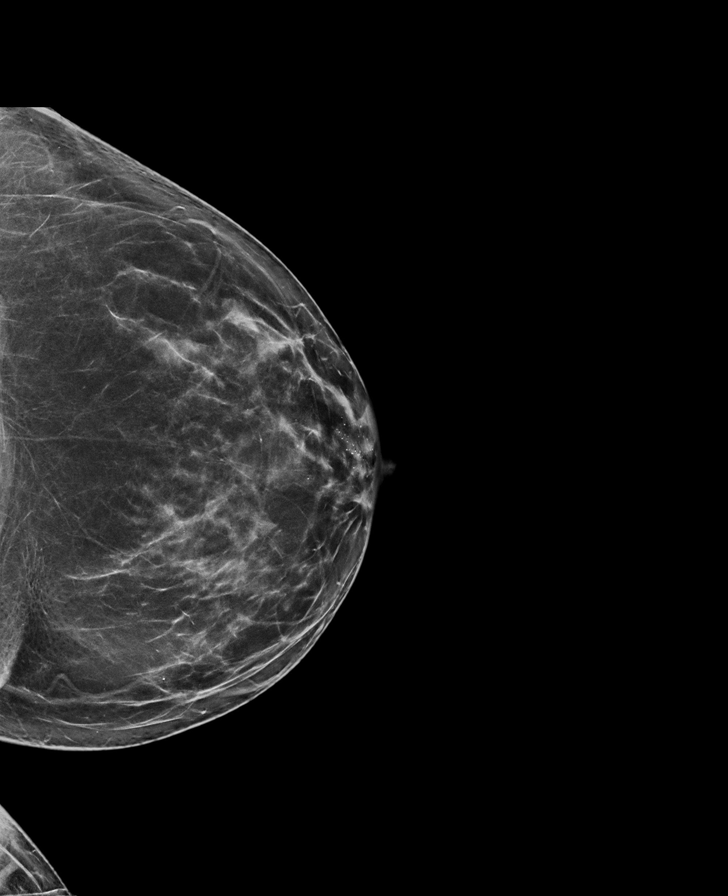

[R CC synth-2D]
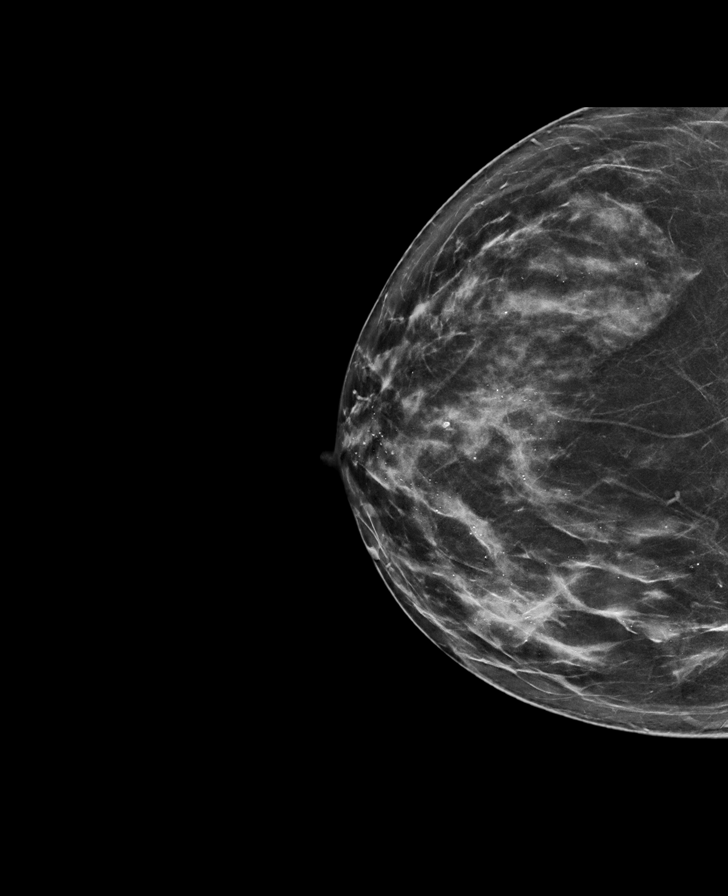

[L MLO synth-2D]
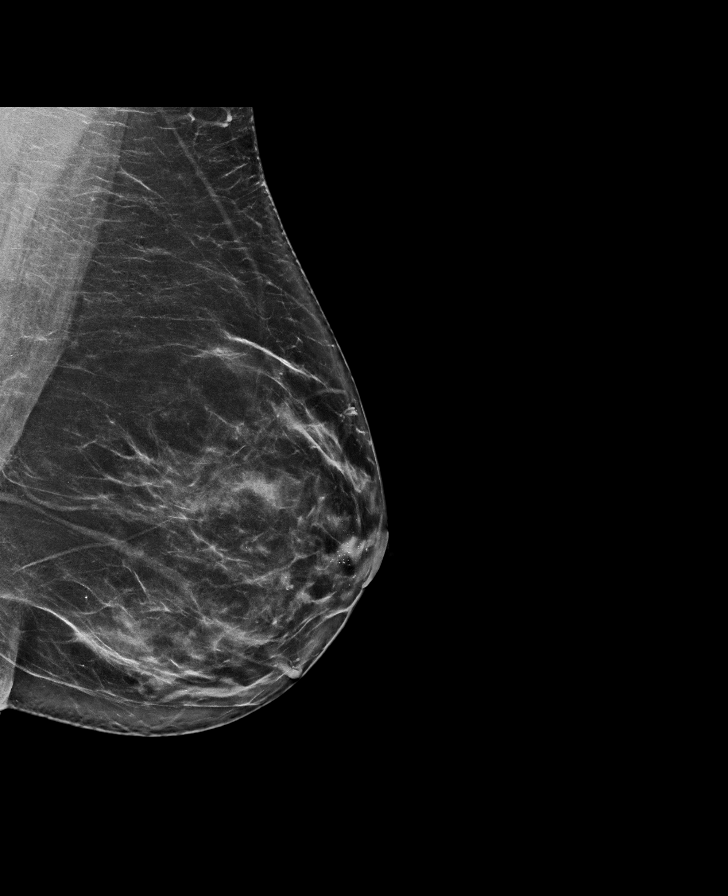

[R MLO synth-2D]
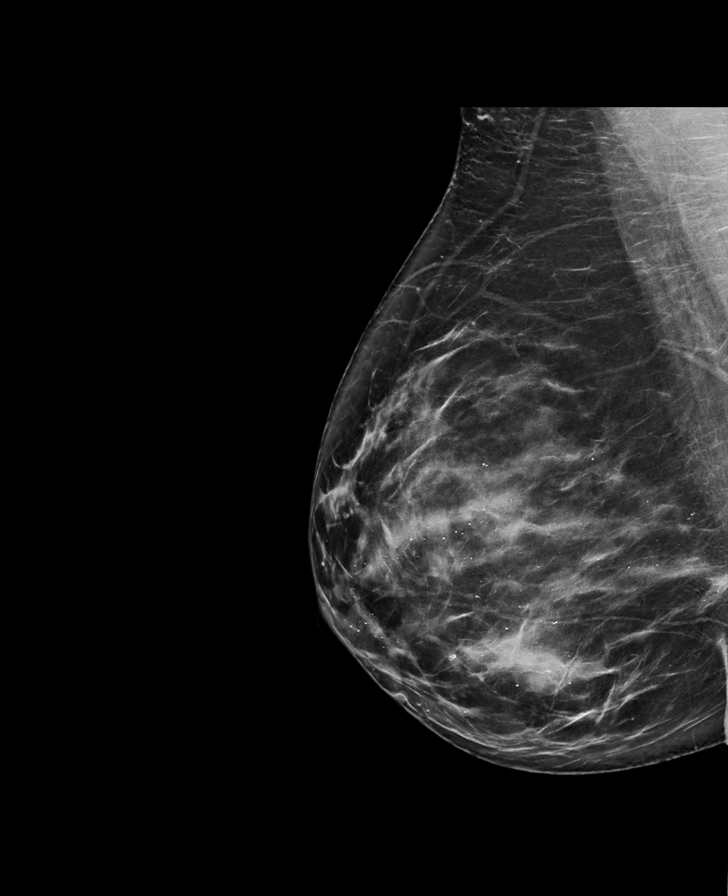

[L CC tomo · tomo slice 41/80.0]
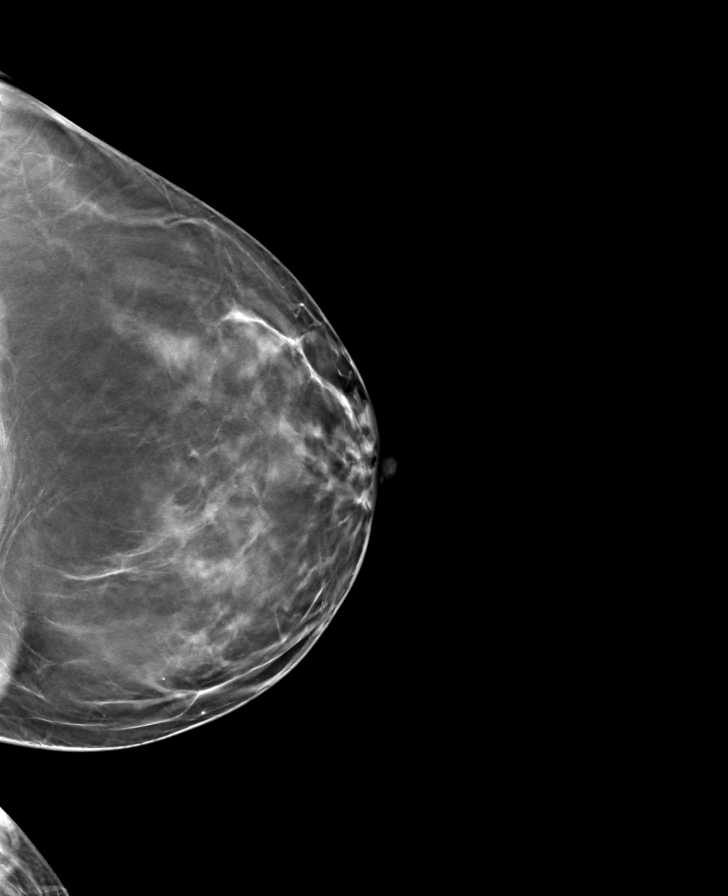

[R CC tomo · tomo slice 39/78.0]
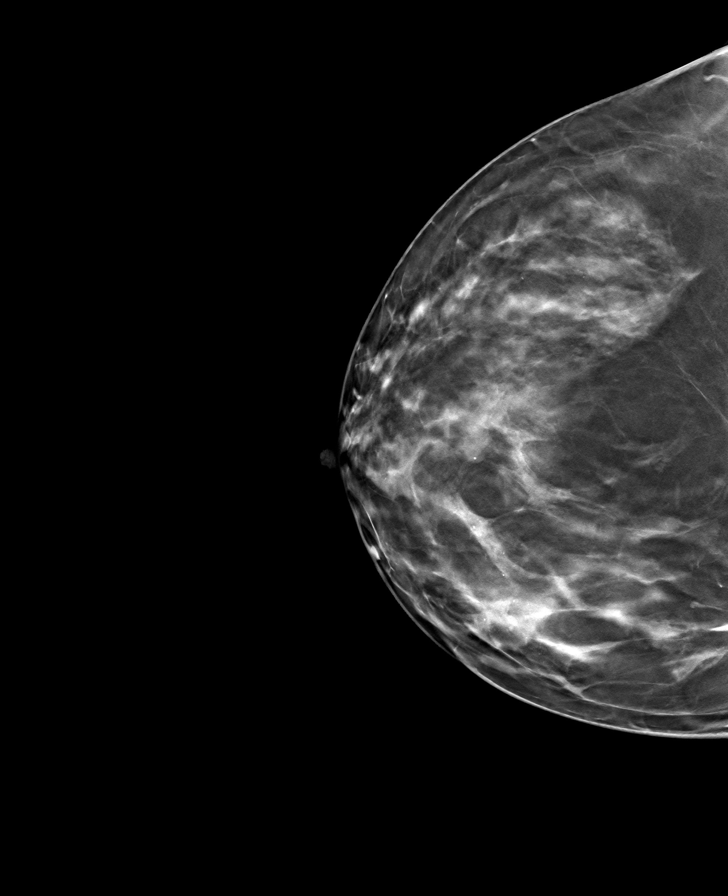

[R MLO tomo · tomo slice 43/86.0]
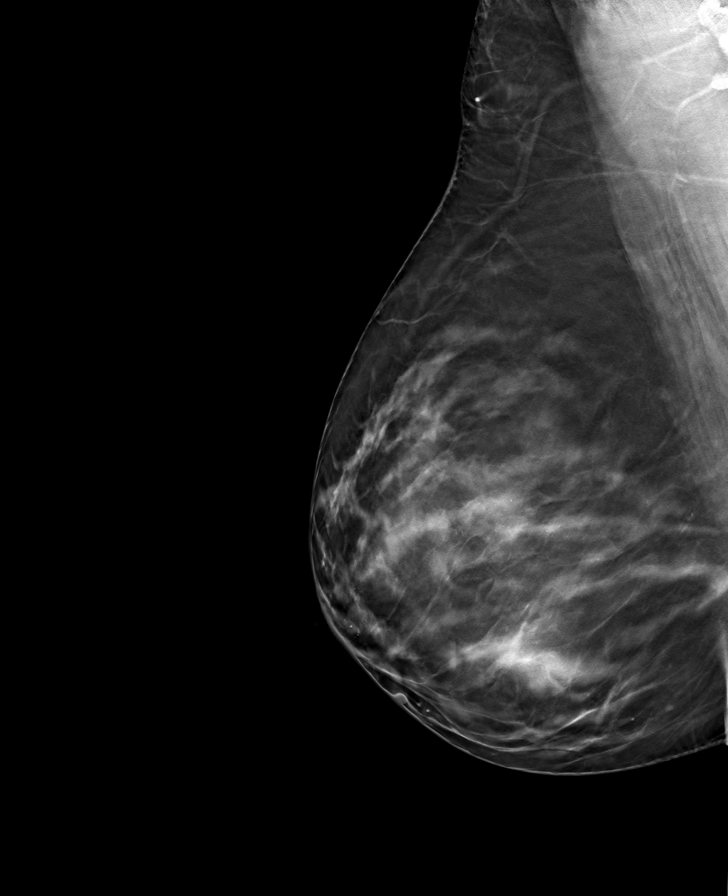

[L MLO tomo · tomo slice 42/83.0]
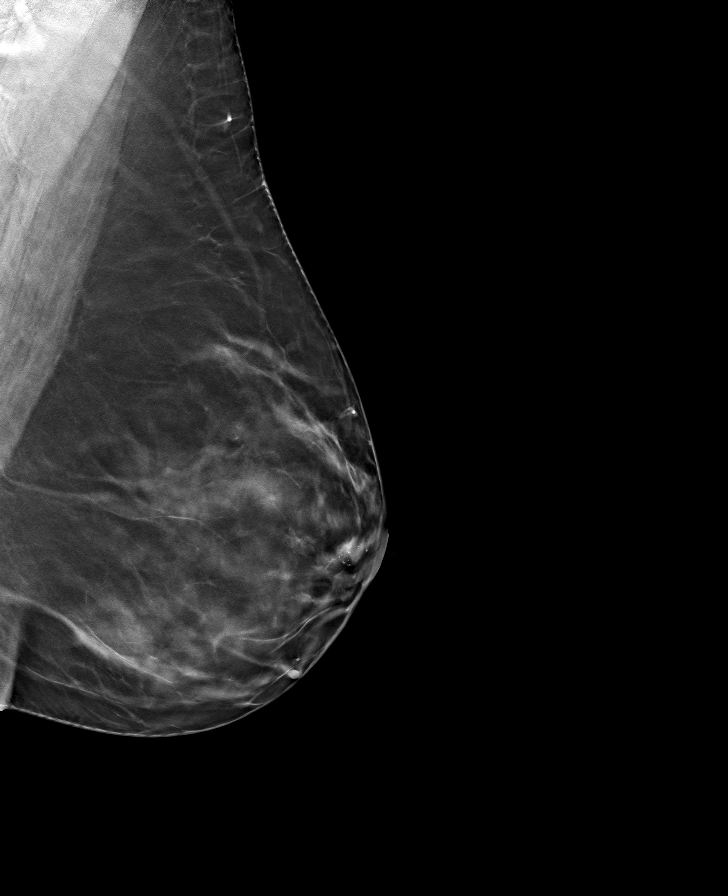

[8 of 24 positions shown; findings below may reference images not displayed]

ACR Breast Density Category b: There are scattered areas of
fibroglandular density.
FINDINGS: There are no findings suspicious for malignancy. Images were
processed with CAD.
IMPRESSION: No mammographic evidence of malignancy. A result letter of this
screening mammogram will be mailed directly to the patient.

RECOMMENDATION:
Screening mammogram in one year. (Code:Y5-G-EJ6)

BI-RADS CATEGORY  1: Negative.

## 2020-06-10 ENCOUNTER — Telehealth: Payer: Self-pay

## 2020-06-10 NOTE — Telephone Encounter (Signed)
Patient is calling in regards to having menopausal symptoms and pain with intercourse.

## 2020-06-10 NOTE — Telephone Encounter (Signed)
Left message for pt to return call to triage RN. 

## 2020-06-10 NOTE — Telephone Encounter (Signed)
Last OV 02/2019 Perimenopausal 01/2018  FSH 27.4  Spoke with pt. Pt states having vasomotor sx of vaginal dryness with pain while having intercourse, also a "heaviness" abd cramping in the last week. Pt denies any vaginal bleeding/spotting at this time. Denies fever, chills and all UTI sx. Pt states has not had  menses since Nov 2020.  Pt also mentioned that she is not taking the HRT that was prescribed in 01/2019 of Prometrium and estradiol patch since April/May 2021 and has been taking natural supplements. Pt calling today since having increased vasomotor sx of hot flashes along with abd cramping and painful intercourse.  Pt advised to have OV to discuss sx and for possible treatment and labs. Pt agreeable.  Pt scheduled for OV with Dr Edward Jolly on 8/19 at 1130 am. Pt agreeable and verbalized understanding to date and time of appt. Pt declines earlier appts offered due to out of town until 06/22/20.  Encounter closed.

## 2020-06-24 NOTE — Progress Notes (Deleted)
GYNECOLOGY  VISIT   HPI: 49 y.o.   Married  Caucasian/Brazilian  female   G1P0001 with No LMP recorded. (Menstrual status: Perimenopausal).   here for vaginal dryness, dyspareunia and hot flashes.    GYNECOLOGIC HISTORY: No LMP recorded. (Menstrual status: Perimenopausal). Contraception:  Menopausal hormone therapy:  *** Last mammogram:  05-04-18 birads 1:neg Last pap smear: 01-25-18 Neg:Neg HR HPV, 2016 Neg--Care Everywhere        OB History    Gravida  1   Para  1   Term  0   Preterm  0   AB  0   Living  1     SAB  0   TAB  0   Ectopic  0   Multiple  0   Live Births  0              Patient Active Problem List   Diagnosis Date Noted  . Decreased libido 02/15/2018  . Perimenopausal vasomotor symptoms 01/25/2018  . Overweight 01/12/2017  . Mixed emotional features as adjustment reaction 01/10/2017  . Recurrent nephrolithiasis 12/17/2015    Past Medical History:  Diagnosis Date  . Anxiety   . Endometriosis   . Hormone disorder   . Infertility, female   . Kidney stones   . Mitral valve prolapse     Past Surgical History:  Procedure Laterality Date  . CESAREAN SECTION    . CYSTOSCOPY W/ URETERAL STENT PLACEMENT Left 01/16/2017   Procedure: CYSTOSCOPY WITH RETROGRADE PYELOGRAM/URETEROSCOPY/ LASER LITHOTRIPSY/URETERAL STENT PLACEMENT;  Surgeon: Heloise Purpura, MD;  Location: [facility];  Service: Urology;  Laterality: Left;  . Laparoscopic for endometriosis     uterus, ovary, and intestine    Current Outpatient Medications  Medication Sig Dispense Refill  . cholecalciferol (VITAMIN D) 1000 units tablet Take 1,000 Units by mouth daily.    Marland Kitchen conjugated estrogens (PREMARIN) vaginal cream 1/2 gram at hs nightly for 2 weeks and then 1/2 gram at hs 2 - 3 times weekly 30 g 2  . estradiol (VIVELLE-DOT) 0.1 MG/24HR patch Place 1 patch (0.1 mg total) onto the skin 2 (two) times a week. (Patient not taking: Reported on 02/15/2019) 16 patch 0  . FLUoxetine HCl (PROZAC  PO) Take 40 mg by mouth daily.     . Melatonin 10 MG CAPS Take 1 tablet by mouth at bedtime.    . progesterone (PROMETRIUM) 200 MG capsule Take 1 capsule (200 mg total) by mouth daily. (Patient not taking: Reported on 02/15/2019) 60 capsule 0   No current facility-administered medications for this visit.     ALLERGIES: Patient has no known allergies.  Family History  Problem Relation Age of Onset  . Hypertension Mother   . Anxiety disorder Mother   . Gallstones Sister   . Alzheimer's disease Maternal Grandmother   . Heart Problems Maternal Grandmother   . Stroke Maternal Grandmother   . Alzheimer's disease Maternal Grandfather   . Alzheimer's disease Paternal Grandmother   . Alzheimer's disease Paternal Grandfather     Social History   Socioeconomic History  . Marital status: Married    Spouse name: Not on file  . Number of children: Not on file  . Years of education: Not on file  . Highest education level: Not on file  Occupational History  . Not on file  Tobacco Use  . Smoking status: Never Smoker  . Smokeless tobacco: Never Used  Vaping Use  . Vaping Use: Never used  Substance and Sexual Activity  .  Alcohol use: No  . Drug use: No  . Sexual activity: Yes    Partners: Male  Other Topics Concern  . Not on file  Social History Narrative  . Not on file   Social Determinants of Health   Financial Resource Strain:   . Difficulty of Paying Living Expenses:   Food Insecurity:   . Worried About Programme researcher, broadcasting/film/video in the Last Year:   . Barista in the Last Year:   Transportation Needs:   . Freight forwarder (Medical):   Marland Kitchen Lack of Transportation (Non-Medical):   Physical Activity:   . Days of Exercise per Week:   . Minutes of Exercise per Session:   Stress:   . Feeling of Stress :   Social Connections:   . Frequency of Communication with Friends and Family:   . Frequency of Social Gatherings with Friends and Family:   . Attends Religious Services:    . Active Member of Clubs or Organizations:   . Attends Banker Meetings:   Marland Kitchen Marital Status:   Intimate Partner Violence:   . Fear of Current or Ex-Partner:   . Emotionally Abused:   Marland Kitchen Physically Abused:   . Sexually Abused:     Review of Systems  PHYSICAL EXAMINATION:    There were no vitals taken for this visit.    General appearance: alert, cooperative and appears stated age Head: Normocephalic, without obvious abnormality, atraumatic Neck: no adenopathy, supple, symmetrical, trachea midline and thyroid normal to inspection and palpation Lungs: clear to auscultation bilaterally Breasts: normal appearance, no masses or tenderness, No nipple retraction or dimpling, No nipple discharge or bleeding, No axillary or supraclavicular adenopathy Heart: regular rate and rhythm Abdomen: soft, non-tender, no masses,  no organomegaly Extremities: extremities normal, atraumatic, no cyanosis or edema Skin: Skin color, texture, turgor normal. No rashes or lesions Lymph nodes: Cervical, supraclavicular, and axillary nodes normal. No abnormal inguinal nodes palpated Neurologic: Grossly normal  Pelvic: External genitalia:  no lesions              Urethra:  normal appearing urethra with no masses, tenderness or lesions              Bartholins and Skenes: normal                 Vagina: normal appearing vagina with normal color and discharge, no lesions              Cervix: no lesions                Bimanual Exam:  Uterus:  normal size, contour, position, consistency, mobility, non-tender              Adnexa: no mass, fullness, tenderness              Rectal exam: {yes no:314532}.  Confirms.              Anus:  normal sphincter tone, no lesions  Chaperone was present for exam.  ASSESSMENT     PLAN     An After Visit Summary was printed and given to the patient.  ______ minutes face to face time of which over 50% was spent in counseling.

## 2020-06-25 ENCOUNTER — Other Ambulatory Visit: Payer: Self-pay

## 2020-06-25 ENCOUNTER — Ambulatory Visit: Payer: BC Managed Care – PPO | Admitting: Obstetrics and Gynecology

## 2020-06-25 ENCOUNTER — Encounter: Payer: Self-pay | Admitting: Obstetrics and Gynecology

## 2020-06-25 VITALS — BP 100/64 | HR 90 | Resp 16 | Ht 69.5 in | Wt 215.4 lb

## 2020-06-25 DIAGNOSIS — Z01419 Encounter for gynecological examination (general) (routine) without abnormal findings: Secondary | ICD-10-CM

## 2020-06-25 DIAGNOSIS — N951 Menopausal and female climacteric states: Secondary | ICD-10-CM

## 2020-06-25 NOTE — Progress Notes (Signed)
49 y.o. G17P0001 Married Caucasian/Brazilian female here for annual exam.    Patient complaining of hot flashes and dyspareunia. Using black cohosh and stopped Tribulus herbal treatment (Due to leg pain). Using melatonin and trazodone to help with sleep.   Called last week with pelvic cramping, HA, and breast pain.  Everything resolved.  Felt like a period was going to start, but did not. No bleeding since October, 2020.  She used systemic estrogen in the past and had leg pain, so she stopped.  May move to another city.   Did Covid vaccine.   PCP: Pollyann Glen, MD  Patient's last menstrual period was 08/08/2019 (approximate).     Period Cycle (Days):  (perimenopausal) Period Pattern: (!) Irregular     Sexually active: Yes.    The current method of family planning is none.    Exercising: Yes.    biking and walking Smoker:  no  Health Maintenance: Pap: 01-25-18 neg HPV HR neg History of abnormal Pap:  no MMG: 05-04-18 birads 1:neg.    Colonoscopy:  n/ BMD:   n/a  Result  n/a TDaP: 05-14-18 Td Gardasil:   no HIV:Neg in preg Hep C:unsure Screening Labs:     reports that she has never smoked. She has never used smokeless tobacco. She reports current alcohol use of about 1.0 standard drink of alcohol per week. She reports that she does not use drugs.  Past Medical History:  Diagnosis Date  . Anxiety   . Endometriosis   . Hormone disorder   . Infertility, female   . Kidney stones   . Mitral valve prolapse     Past Surgical History:  Procedure Laterality Date  . CESAREAN SECTION    . CYSTOSCOPY W/ URETERAL STENT PLACEMENT Left 01/16/2017   Procedure: CYSTOSCOPY WITH RETROGRADE PYELOGRAM/URETEROSCOPY/ LASER LITHOTRIPSY/URETERAL STENT PLACEMENT;  Surgeon: Heloise Purpura, MD;  Location: [facility];  Service: Urology;  Laterality: Left;  . Laparoscopic for endometriosis     uterus, ovary, and intestine    Current Outpatient Medications  Medication Sig Dispense Refill  .  Black Cohosh 40 MG CAPS Take 1 capsule by mouth daily.    . cetirizine (ZYRTEC) 10 MG tablet Take by mouth.    . cholecalciferol (VITAMIN D) 1000 units tablet Take 1,000 Units by mouth daily.    . Digestive Enzymes (DIGESTIVE ENZYME PO) Take 1 tablet by mouth daily.    Marland Kitchen FLUoxetine (PROZAC) 20 MG capsule Take by mouth.    Marland Kitchen FLUoxetine HCl (PROZAC PO) Take 40 mg by mouth daily.     . Maca Root (MACA PO) Take 1 tablet by mouth daily.    . Melatonin 10 MG CAPS Take 1 tablet by mouth at bedtime.    . Multiple Vitamins-Minerals (ONE-A-DAY WOMENS 50 PLUS PO) Take 1 capsule by mouth daily.    Marland Kitchen QUERCETIN PO Take 1 tablet by mouth daily.    . traZODone (DESYREL) 50 MG tablet Take 50 mg by mouth at bedtime.    . TRINTELLIX 5 MG TABS tablet Take 5 mg by mouth daily.    . vitamin B-12 (CYANOCOBALAMIN) 500 MCG tablet Take by mouth.     No current facility-administered medications for this visit.    Family History  Problem Relation Age of Onset  . Hypertension Mother   . Anxiety disorder Mother   . Gallstones Sister   . Alzheimer's disease Maternal Grandmother   . Heart Problems Maternal Grandmother   . Stroke Maternal Grandmother   . Alzheimer's  disease Maternal Grandfather   . Alzheimer's disease Paternal Grandmother   . Alzheimer's disease Paternal Grandfather     Review of Systems  All other systems reviewed and are negative.   Exam:   BP 100/64 (Cuff Size: Large)   Pulse 90   Resp 16   Ht 5' 9.5" (1.765 m)   Wt 215 lb 6.4 oz (97.7 kg)   LMP 08/08/2019 (Approximate)   BMI 31.35 kg/m     General appearance: alert, cooperative and appears stated age Head: normocephalic, without obvious abnormality, atraumatic Neck: no adenopathy, supple, symmetrical, trachea midline and thyroid normal to inspection and palpation Lungs: clear to auscultation bilaterally Breasts: normal appearance, no masses or tenderness, No nipple retraction or dimpling, No nipple discharge or bleeding, No  axillary adenopathy Heart: regular rate and rhythm Abdomen: soft, non-tender; no masses, no organomegaly Extremities: extremities normal, atraumatic, no cyanosis or edema Skin: skin color, texture, turgor normal. No rashes or lesions Lymph nodes: cervical, supraclavicular, and axillary nodes normal. Neurologic: grossly normal  Pelvic: External genitalia:  no lesions              No abnormal inguinal nodes palpated.              Urethra:  normal appearing urethra with no masses, tenderness or lesions              Bartholins and Skenes: normal                 Vagina: normal appearing vagina with normal color and discharge, no lesions              Cervix: no lesions              Pap taken: No. Bimanual Exam:  Uterus:  normal size, contour, position, consistency, mobility, non-tender              Adnexa: no mass, fullness, tenderness              Rectal exam: Yes.  .  Confirms.              Anus:  normal sphincter tone, no lesions  Chaperone was present for exam.  Assessment:   Well woman visit with normal exam. Menopausal symptoms.   Plan: Mammogram screening discussed. Self breast awareness reviewed. Pap and HR HPV as above. Guidelines for Calcium, Vitamin D, regular exercise program including cardiovascular and weight bearing exercise. Check FSH and E2.  Will consider Estring after mammogram back.  We discussed vaginal estrogen cream and tablets as alternatives to Estring.  Follow up annually and prn.   After visit summary provided.

## 2020-06-25 NOTE — Patient Instructions (Signed)
EXERCISE AND DIET:  We recommended that you start or continue a regular exercise program for good health. Regular exercise means any activity that makes your heart beat faster and makes you sweat.  We recommend exercising at least 30 minutes per day at least 3 days a week, preferably 4 or 5.  We also recommend a diet low in fat and sugar.  Inactivity, poor dietary choices and obesity can cause diabetes, heart attack, stroke, and kidney damage, among others.    ALCOHOL AND SMOKING:  Women should limit their alcohol intake to no more than 7 drinks/beers/glasses of wine (combined, not each!) per week. Moderation of alcohol intake to this level decreases your risk of breast cancer and liver damage. And of course, no recreational drugs are part of a healthy lifestyle.  And absolutely no smoking or even second hand smoke. Most people know smoking can cause heart and lung diseases, but did you know it also contributes to weakening of your bones? Aging of your skin?  Yellowing of your teeth and nails?  CALCIUM AND VITAMIN D:  Adequate intake of calcium and Vitamin D are recommended.  The recommendations for exact amounts of these supplements seem to change often, but generally speaking 600 mg of calcium (either carbonate or citrate) and 800 units of Vitamin D per day seems prudent. Certain women may benefit from higher intake of Vitamin D.  If you are among these women, your doctor will have told you during your visit.    PAP SMEARS:  Pap smears, to check for cervical cancer or precancers,  have traditionally been done yearly, although recent scientific advances have shown that most women can have pap smears less often.  However, every woman still should have a physical exam from her gynecologist every year. It will include a breast check, inspection of the vulva and vagina to check for abnormal growths or skin changes, a visual exam of the cervix, and then an exam to evaluate the size and shape of the uterus and  ovaries.  And after 47 years of age, a rectal exam is indicated to check for rectal cancers. We will also provide age appropriate advice regarding health maintenance, like when you should have certain vaccines, screening for sexually transmitted diseases, bone density testing, colonoscopy, mammograms, etc.   MAMMOGRAMS:  All women over 40 years old should have a yearly mammogram. Many facilities now offer a "3D" mammogram, which may cost around $50 extra out of pocket. If possible,  we recommend you accept the option to have the 3D mammogram performed.  It both reduces the number of women who will be called back for extra views which then turn out to be normal, and it is better than the routine mammogram at detecting truly abnormal areas.    COLONOSCOPY:  Colonoscopy to screen for colon cancer is recommended for all women at age 50.  We know, you hate the idea of the prep.  We agree, BUT, having colon cancer and not knowing it is worse!!  Colon cancer so often starts as a polyp that can be seen and removed at colonscopy, which can quite literally save your life!  And if your first colonoscopy is normal and you have no family history of colon cancer, most women don't have to have it again for 10 years.  Once every ten years, you can do something that may end up saving your life, right?  We will be happy to help you get it scheduled when you are ready.    Be sure to check your insurance coverage so you understand how much it will cost.  It may be covered as a preventative service at no cost, but you should check your particular policy.     Estradiol vaginal cream What is this medicine? ESTRADIOL (es tra DYE ole) contains the female hormone estrogen. It is used for symptoms of menopause, like vaginal dryness and irritation. This medicine may be used for other purposes; ask your health care provider or pharmacist if you have questions. COMMON BRAND NAME(S): Estrace What should I tell my health care provider  before I take this medicine? They need to know if you have any of these conditions:  abnormal vaginal bleeding  blood vessel disease or blood clots  breast, cervical, endometrial, ovarian, liver, or uterine cancer  dementia  diabetes  gallbladder disease  heart disease or recent heart attack  high blood pressure  high cholesterol  high levels of calcium in the blood  hysterectomy  kidney disease  liver disease  migraine headaches  protein C deficiency  protein S deficiency  stroke  systemic lupus erythematosus (SLE)  tobacco smoker  an unusual or allergic reaction to estrogens, other hormones, soy, other medicines, foods, dyes, or preservatives  pregnant or trying to get pregnant  breast-feeding How should I use this medicine? This medicine is for use in the vagina only. Do not take by mouth. Follow the directions on the prescription label. Read package directions carefully before using. Use the special applicator supplied with the cream. Wash hands before and after use. Fill the applicator with the prescribed amount of cream. Lie on your back, part and bend your knees. Insert the applicator into the vagina and push the plunger to expel the cream into the vagina. Wash the applicator with warm soapy water and rinse well. Use exactly as directed for the complete length of time prescribed. Do not stop using except on the advice of your doctor or health care professional. A patient package insert for the product will be given with each prescription and refill. Read this sheet carefully each time. The sheet may change frequently. Talk to your pediatrician regarding the use of this medicine in children. This medicine is not approved for use in children. Overdosage: If you think you have taken too much of this medicine contact a poison control center or emergency room at once. NOTE: This medicine is only for you. Do not share this medicine with others. What if I miss a  dose? If you miss a dose, use it as soon as you can. If it is almost time for your next dose, use only that dose. Do not use double or extra doses. What may interact with this medicine? Do not take this medicine with any of the following medications:  aromatase inhibitors like aminoglutethimide, anastrozole, exemestane, letrozole, testolactone This medicine may also interact with the following medications:  barbiturates used for inducing sleep or treating seizures  carbamazepine  grapefruit juice  medicines for fungal infections like ketoconazole and itraconazole  raloxifene  rifabutin  rifampin  rifapentine  ritonavir  some antibiotics used to treat infections  St. John's Wort  tamoxifen  warfarin This list may not describe all possible interactions. Give your health care provider a list of all the medicines, herbs, non-prescription drugs, or dietary supplements you use. Also tell them if you smoke, drink alcohol, or use illegal drugs. Some items may interact with your medicine. What should I watch for while using this medicine? Visit your health  care professional for regular checks on your progress. You will need a regular breast and pelvic exam. You should also discuss the need for regular mammograms with your health care professional, and follow his or her guidelines. This medicine can make your body retain fluid, making your fingers, hands, or ankles swell. Your blood pressure can go up. Contact your doctor or health care professional if you feel you are retaining fluid. If you have any reason to think you are pregnant, stop taking this medicine at once and contact your doctor or health care professional. Tobacco smoking increases the risk of getting a blood clot or having a stroke, especially if you are more than 47 years old. You are strongly advised not to smoke. If you wear contact lenses and notice visual changes, or if the lenses begin to feel uncomfortable, consult  your eye care specialist. If you are going to have elective surgery, you may need to stop taking this medicine beforehand. Consult your health care professional for advice prior to scheduling the surgery. What side effects may I notice from receiving this medicine? Side effects that you should report to your doctor or health care professional as soon as possible:  allergic reactions like skin rash, itching or hives, swelling of the face, lips, or tongue  breast tissue changes or discharge  changes in vision  chest pain  confusion, trouble speaking or understanding  dark urine  general ill feeling or flu-like symptoms  light-colored stools  nausea, vomiting  pain, swelling, warmth in the leg  right upper belly pain  severe headaches  shortness of breath  sudden numbness or weakness of the face, arm or leg  trouble walking, dizziness, loss of balance or coordination  unusual vaginal bleeding  yellowing of the eyes or skin Side effects that usually do not require medical attention (report to your doctor or health care professional if they continue or are bothersome):  hair loss  increased hunger or thirst  increased urination  symptoms of vaginal infection like itching, irritation or unusual discharge  unusually weak or tired This list may not describe all possible side effects. Call your doctor for medical advice about side effects. You may report side effects to FDA at 1-800-FDA-1088. Where should I keep my medicine? Keep out of the reach of children. Store at room temperature between 15 and 30 degrees C (59 and 86 degrees F). Protect from temperatures above 40 degrees C (104 degrees C). Do not freeze. Throw away any unused medicine after the expiration date. NOTE: This sheet is a summary. It may not cover all possible information. If you have questions about this medicine, talk to your doctor, pharmacist, or health care provider.  2020 [employer]/Gold Standard  (2011-01-26 09:18:12) Estradiol vaginal tablets What is this medicine? ESTRADIOL (es tra DYE ole) vaginal tablet is used to help relieve symptoms of vaginal irritation and dryness that occurs in some women during menopause. This medicine may be used for other purposes; ask your health care provider or pharmacist if you have questions. COMMON BRAND NAME(S): Vagifem What should I tell my health care provider before I take this medicine? They need to know if you have any of these conditions:  abnormal vaginal bleeding  blood vessel disease or blood clots  breast, cervical, endometrial, ovarian, liver, or uterine cancer  dementia  diabetes  gallbladder disease  heart disease or recent heart attack  high blood pressure  high cholesterol  high level of calcium in the blood  hysterectomy  kidney disease  liver disease  migraine headaches  protein C deficiency  protein S deficiency  stroke  systemic lupus erythematosus (SLE)  tobacco smoker  an unusual or allergic reaction to estrogens, other hormones, medicines, foods, dyes, or preservatives  pregnant or trying to get pregnant  breast-feeding How should I use this medicine? This medicine is only for use in the vagina. Do not take by mouth. Wash and dry your hands before and after use. Read package directions carefully. Unwrap the applicator package. Be sure to use a new applicator for each dose. Use at the same time each day. If the tablet has fallen out of the applicator, but is still in the package, carefully place it back into the applicator. If the tablet has fallen out of the package, that applicator should be thrown out and you should use a new applicator containing a new tablet. Lie on your back, part and bend your knees. Gently insert the applicator as far as comfortably possible into the vagina. Then, gently press the plunger until the plunger is fully depressed. This will release the tablet into the vagina.  Gently remove the applicator. Throw away the applicator after use. Do not use your medicine more often than directed. Do not stop using except on the advice of your doctor or health care professional. Talk to your pediatrician regarding the use of this medicine in children. This medicine is not approved for use in children. A patient package insert for the product will be given with each prescription and refill. Read this sheet carefully each time. The sheet may change frequently. Overdosage: If you think you have taken too much of this medicine contact a poison control center or emergency room at once. NOTE: This medicine is only for you. Do not share this medicine with others. What if I miss a dose? If you miss a dose, take it as soon as you can. If it is almost time for your next dose, take only that dose. Do not take double or extra doses. What may interact with this medicine? Do not take this medicine with any of the following medications:  aromatase inhibitors like aminoglutethimide, anastrozole, exemestane, letrozole, testolactone This medicine may also interact with the following medications:  antibiotics used to treat tuberculosis like rifabutin, rifampin and rifapentene  raloxifene or tamoxifen  warfarin This list may not describe all possible interactions. Give your health care provider a list of all the medicines, herbs, non-prescription drugs, or dietary supplements you use. Also tell them if you smoke, drink alcohol, or use illegal drugs. Some items may interact with your medicine. What should I watch for while using this medicine? Visit your health care professional for regular checks on your progress. You will need a regular breast and pelvic exam. You should also discuss the need for regular mammograms with your health care professional, and follow his or her guidelines. This medicine can make your body retain fluid, making your fingers, hands, or ankles swell. Your blood  pressure can go up. Contact your doctor or health care professional if you feel you are retaining fluid. If you have any reason to think you are pregnant; stop taking this medicine at once and contact your doctor or health care professional. Tobacco smoking increases the risk of getting a blood clot or having a stroke, especially if you are more than 47 years old. You are strongly advised not to smoke. If you wear contact lenses and notice visual changes, or if the lenses begin to  feel uncomfortable, consult your eye care specialist. If you are going to have elective surgery, you may need to stop taking this medicine beforehand. Consult your health care professional for advice prior to scheduling the surgery. What side effects may I notice from receiving this medicine? Side effects that you should report to your doctor or health care professional as soon as possible:  allergic reactions like skin rash, itching or hives, swelling of the face, lips, or tongue  breast tissue changes or discharge  changes in vision  chest pain  confusion, trouble speaking or understanding  dark urine  general ill feeling or flu-like symptoms  light-colored stools  nausea, vomiting  pain, swelling, warmth in the leg  right upper belly pain  severe headaches  shortness of breath  sudden numbness or weakness of the face, arm or leg  trouble walking, dizziness, loss of balance or coordination  unusual vaginal bleeding  yellowing of the eyes or skin Side effects that usually do not require medical attention (report to your doctor or health care professional if they continue or are bothersome):  hair loss  increased hunger or thirst  increased urination  symptoms of vaginal infection like itching, irritation or unusual discharge  unusually weak or tired This list may not describe all possible side effects. Call your doctor for medical advice about side effects. You may report side effects to  FDA at 1-800-FDA-1088. Where should I keep my medicine? Keep out of the reach of children. Store at room temperature between 15 and 30 degrees C (59 and 86 degrees F). Throw away any unused medicine after the expiration date. NOTE: This sheet is a summary. It may not cover all possible information. If you have questions about this medicine, talk to your doctor, pharmacist, or health care provider.  2020 [employer]/Gold Standard (2014-10-08 09:22:51) Estradiol vaginal ring (Estring) What is this medicine? ESTRADIOL (es tra DYE ole) vaginal ring is an insert that contains a female hormone. This medicine helps relieve symptoms of vaginal irritation and dryness that occurs in some women during menopause. This medicine may be used for other purposes; ask your health care provider or pharmacist if you have questions. COMMON BRAND NAME(S): Estring What should I tell my health care provider before I take this medicine? They need to know if you have any of these conditions:  abnormal vaginal bleeding  blood vessel disease or blood clots  breast, cervical, endometrial, ovarian, liver, or uterine cancer  dementia  diabetes  gallbladder disease  heart disease or recent heart attack  high blood pressure  high cholesterol  high level of calcium in the blood  hysterectomy  kidney disease  liver disease  migraine headaches  protein C deficiency  protein S deficiency  stroke  systemic lupus erythematosus (SLE)  tobacco smoker  an unusual or allergic reaction to estrogens, other hormones, medicines, foods, dyes, or preservatives  pregnant or trying to get pregnant  breast-feeding How should I use this medicine? This medicine may be inserted by you or your physician. Follow the directions that are included with your prescription. If you are unsure how to insert the ring, contact your doctor or health care professional. The vaginal ring should remain in place for 90 days. After  90 days you should replace your old ring and insert a new one. Do not stop using except on the advice of your doctor or health care professional. Contact your pediatrician regarding the use of this medicine in children. Special care may be needed.  A patient package insert for the product will be given with each prescription and refill. Read this sheet carefully each time. The sheet may change frequently. Overdosage: If you think you have taken too much of this medicine contact a poison control center or emergency room at once. NOTE: This medicine is only for you. Do not share this medicine with others. What if I miss a dose? If you miss a dose, use it as soon as you can. If it is almost time for your next dose, use only that dose. Do not use double or extra doses. What may interact with this medicine? Do not take this medicine with any of the following medications:  aromatase inhibitors like aminoglutethimide, anastrozole, exemestane, letrozole, testolactone, vorozole This medicine may also interact with the following medications:  carbamazepine  certain antibiotics used to treat infections  certain barbiturates used for inducing sleep or treating seizures  grapefruit juice  medicines for fungus infections like itraconazole and ketoconazole  raloxifene or tamoxifen  rifabutin, rifampin, or rifapentine  ritonavir  St. John's Wort This list may not describe all possible interactions. Give your health care provider a list of all the medicines, herbs, non-prescription drugs, or dietary supplements you use. Also tell them if you smoke, drink alcohol, or use illegal drugs. Some items may interact with your medicine. What should I watch for while using this medicine? Visit your doctor or health care professional for regular checks on your progress. You will need a regular breast and pelvic exam and Pap smear while on this medicine. You should also discuss the need for regular mammograms with  your health care professional, and follow his or her guidelines for these tests. This medicine can make your body retain fluid, making your fingers, hands, or ankles swell. Your blood pressure can go up. Contact your doctor or health care professional if you feel you are retaining fluid. If you have any reason to think you are pregnant, stop taking this medicine right away and contact your doctor or health care professional. Smoking increases the risk of getting a blood clot or having a stroke while you are taking this medicine, especially if you are more than 47 years old. You are strongly advised not to smoke. If you wear contact lenses and notice visual changes, or if the lenses begin to feel uncomfortable, consult your eye doctor or health care professional. This medicine can increase the risk of developing a condition (endometrial hyperplasia) that may lead to cancer of the lining of the uterus. Taking progestins, another hormone drug, with this medicine lowers the risk of developing this condition. Therefore, if your uterus has not been removed (by a hysterectomy), your doctor may prescribe a progestin for you to take together with your estrogen. You should know, however, that taking estrogens with progestins may have additional health risks. You should discuss the use of estrogens and progestins with your health care professional to determine the benefits and risks for you. If you are going to have surgery, you may need to stop taking this medicine. Consult your health care professional for advice before you schedule the surgery. You may bathe or participate in other activities while using this medicine. You do not need to remove the vaginal ring during sexual or other activities unless you are more comfortable doing so. Within the 90-day dosage period, you may remove the vaginal ring, rinse it with clean lukewarm (not hot or boiling) water, and re-insert the ring as needed. What side effects may I  notice from receiving this medicine? Side effects that you should report to your doctor or health care professional as soon as possible:  allergic reactions like skin rash, itching or hives, swelling of the face, lips, or tongue  breast tissue changes or discharge  signs and symptoms of a blood clot such as breathing problems; changes in vision; chest pain; severe, sudden headache; pain, swelling, warmth in the leg; trouble speaking; sudden numbness or weakness of the face, arm or leg  signs and symptoms of infection like fever or chills; vomiting; diarrhea; muscle pain; dizziness; or a red, sunburn-like rash on face and body  signs and symptoms of liver injury like dark yellow or brown urine; general ill feeling or flu-like symptoms; light-colored stools; loss of appetite; nausea; right upper belly pain; unusually weak or tired; yellowing of the eyes or skin  symptoms of bowel blockage like constipation, abdominal swelling, abdominal pain, inability to pass gas or have a bowel movement  symptoms of vaginal infection like itching, irritation or unusual discharge  unusual or increased vaginal bleeding  vaginal pain or soreness, redness, swelling Side effects that usually do not require medical attention (report to your doctor or health care professional if they continue or are bothersome):  breast tenderness  fluid retention  hair loss  headache  nausea  upset stomach  vaginal spotting This list may not describe all possible side effects. Call your doctor for medical advice about side effects. You may report side effects to FDA at 1-800-FDA-1088. Where should I keep my medicine? Keep out of the reach of children. Store at room temperature between 15 and 25 degrees C (59 and 77 degrees F). Throw away any unused medicine after the expiration date. NOTE: This sheet is a summary. It may not cover all possible information. If you have questions about this medicine, talk to your  doctor, pharmacist, or health care provider.  2020 [employer]/Gold Standard (2014-08-18 13:20:25)

## 2020-06-26 ENCOUNTER — Telehealth: Payer: Self-pay

## 2020-06-26 ENCOUNTER — Other Ambulatory Visit: Payer: Self-pay | Admitting: Obstetrics and Gynecology

## 2020-06-26 DIAGNOSIS — Z1231 Encounter for screening mammogram for malignant neoplasm of breast: Secondary | ICD-10-CM

## 2020-06-26 LAB — ESTRADIOL: Estradiol: 29.2 pg/mL

## 2020-06-26 LAB — FOLLICLE STIMULATING HORMONE: FSH: 33.7 m[IU]/mL

## 2020-06-26 MED ORDER — MEDROXYPROGESTERONE ACETATE 10 MG PO TABS: 10.0000 mg | ORAL_TABLET | Freq: Every day | ORAL | 0 refills | Status: DC

## 2020-06-26 NOTE — Telephone Encounter (Signed)
Patient returned call

## 2020-06-26 NOTE — Telephone Encounter (Signed)
-----   Message from Patton Salles, MD sent at 06/26/2020  9:03 AM EDT ----- Please contact patient with results of testing.  Her levels are still looking more perimenopausal than postmenopausal.  I recommend a course of Provera 10 mg x 10 days to shed any build up of the endometrial lining.    Ok to take the Provera now.  It may take up to 2 weeks for her to have a period following this medication.  She may feel temporarily bloating while taking it.   She is pursuing a mammogram in preparation for potential vaginal estrogen treatment.

## 2020-06-26 NOTE — Telephone Encounter (Signed)
Left message for pt to return call to triage RN. 

## 2020-06-26 NOTE — Telephone Encounter (Signed)
Spoke with pt. Pt given results and recommendations per Dr Edward Jolly. Pt agreeable and verbalized understanding. Rx sent to pharmacy on file. Pt verbalized understanding of taking Provera Rx  and what to expect. Pt states is calling [facility] today to have screening MMG and will return call when complete to get vaginal estrogen treatment started.  Encounter closed.  Rx sent Provera 10mg  # 10, 0RF. Pharmacy verified by pt.

## 2020-06-29 ENCOUNTER — Telehealth: Payer: Self-pay

## 2020-06-29 NOTE — Telephone Encounter (Signed)
Thank you for the update.  I agree with not taking the Provera. I will look forward to receiving her mammogram.

## 2020-06-29 NOTE — Telephone Encounter (Signed)
Left message with [facility] for return call.

## 2020-06-29 NOTE — Telephone Encounter (Signed)
Patient is calling in regards to update triage nurse Judeth Cornfield regarding mammogram and symptoms. Patient stated she also had questions.

## 2020-06-29 NOTE — Telephone Encounter (Signed)
Spoke with pt. Pt calling to give update to Dr Edward Jolly. Pt states started cycle on 06/28/20 on her own without starting Provera Rx. Pt advised to not take Rx now. Pt states having regular cycle bleeding. Denies heavy bleeding or clots.   Pt also states had screening MMG done today at [facility] with Dr Juliene Pina due to long wait at [facility]. Will request results sent to our office. Pt agreeable.  Pt advised when results reviewed, will return call and give recommendations for vaginal estrogen treatment per Dr Edward Jolly. Pt agreeable and verbalized understanding.   Routing to Dr Edward Jolly for update

## 2020-06-30 NOTE — Telephone Encounter (Signed)
Received fax from [facility] and will place results of MMG on Dr Rica Records desk for review.

## 2020-06-30 NOTE — Telephone Encounter (Signed)
Left message for pt to return call to triage RN. 

## 2020-06-30 NOTE — Telephone Encounter (Signed)
[facility] called to let you know patient's mammogram was faxed.

## 2020-06-30 NOTE — Telephone Encounter (Signed)
Mammogram 06/29/20 at [facility]. BI-RADS 2.   Ok to start Estrace vaginal cream 1/2 gram pv at hs x 2 weeks and then 1/2 gram pv at hs twice weekly.  Disp:  42.5 gram.  RF:  2.

## 2020-07-01 MED ORDER — ESTRING 2 MG VA RING: 2.0000 mg | VAGINAL_RING | VAGINAL | 3 refills | Status: DC

## 2020-07-01 NOTE — Telephone Encounter (Signed)
Spoke back with pt. Pt given update on Rx. Pt agreeable and verbalized understanding of Rx and use. Rx Estring sent to pharmacy on file. # 1 ring, 3 RF. Pt will be relocating to [facility] soon and will call and transfer Rx to new pharmacy once settled. Encounter closed.

## 2020-07-01 NOTE — Telephone Encounter (Signed)
Spoke with pt. Pt given results and recommendations per Dr Edward Jolly on [facility]. Pt agreeable. Pt states would like to use vaginal ring instead of vaginal estrogen cream. The Estring is covered with insurance.  Pt advised will review with Dr Edward Jolly and return call. Pt agreeable.   Routing to Dr Edward Jolly.

## 2020-07-01 NOTE — Telephone Encounter (Signed)
Left message for pt to return call to triage RN. 

## 2020-07-01 NOTE — Telephone Encounter (Signed)
Ok for [employer].  Place every 90 days.   Refills until annual exam would be due.   Patient is relocating.

## 2020-07-07 ENCOUNTER — Telehealth: Payer: Self-pay

## 2020-07-07 NOTE — Telephone Encounter (Signed)
Patient is calling to speak with [facility]. No further information given.

## 2020-07-07 NOTE — Telephone Encounter (Signed)
Spoke with pt. Pt states went to pick up Rx for Estring and states is on hold per pharmacy and was told to call physician office.   Advised pt will call [facility] and return call with recommendations. Pt agreeable.

## 2020-07-07 NOTE — Telephone Encounter (Signed)
Left message for pt to return call to triage RN. 

## 2020-07-07 NOTE — Telephone Encounter (Signed)
Spoke with [facility]. States Estring is not covered with insurance. Pt has alternatives to use:  Estradiol(yuvafem) inserts or Premarin cream.   Call placed back to pt. Left message for pt to return call to office.

## 2020-07-07 NOTE — Telephone Encounter (Signed)
Spoke with pt. Pt given update on Rx alt meds. Pt agreeable to try estradiol Yuvafem tablets.  Advised will update Dr Edward Jolly and return call with Rx instructions. Pt agreeable.   Routing to Dr Edward Jolly. Please advise.

## 2020-07-08 MED ORDER — ESTRADIOL 10 MCG VA TABS: ORAL_TABLET | VAGINAL | 3 refills | Status: DC

## 2020-07-08 NOTE — Telephone Encounter (Signed)
Ok to discontinue Estring and start Vagifem generic 10 mcg.  Place one tablet per vagina at hs x 2 weeks.  Then place one tablet per vagina at hs twice a week at hs.  Disp:  34 for first Rx and then 3 refills of 24 tablets.

## 2020-07-08 NOTE — Telephone Encounter (Signed)
Left message for pt to return call to triage RN. 

## 2020-07-08 NOTE — Telephone Encounter (Signed)
Spoke with pt. Pt given update and recommendations per Dr Edward Jolly. Pt agreeable and verbalized understanding.  Rx for estradiol(vagifem)  Vaginal  tablets sent to pharmacy on file.  Encounter closed.

## 2020-07-15 DIAGNOSIS — M25532 Pain in left wrist: Secondary | ICD-10-CM | POA: Insufficient documentation

## 2022-01-27 LAB — HM MAMMOGRAPHY

## 2022-03-10 DIAGNOSIS — F419 Anxiety disorder, unspecified: Secondary | ICD-10-CM | POA: Insufficient documentation

## 2022-03-10 DIAGNOSIS — N809 Endometriosis, unspecified: Secondary | ICD-10-CM | POA: Insufficient documentation

## 2022-03-10 DIAGNOSIS — F410 Panic disorder [episodic paroxysmal anxiety] without agoraphobia: Secondary | ICD-10-CM | POA: Insufficient documentation

## 2022-03-29 LAB — HM COLONOSCOPY

## 2022-09-27 DIAGNOSIS — F411 Generalized anxiety disorder: Secondary | ICD-10-CM | POA: Diagnosis not present

## 2022-10-11 DIAGNOSIS — F411 Generalized anxiety disorder: Secondary | ICD-10-CM | POA: Diagnosis not present

## 2022-11-15 DIAGNOSIS — F411 Generalized anxiety disorder: Secondary | ICD-10-CM | POA: Diagnosis not present

## 2022-12-27 DIAGNOSIS — F411 Generalized anxiety disorder: Secondary | ICD-10-CM | POA: Diagnosis not present

## 2023-03-14 DIAGNOSIS — F411 Generalized anxiety disorder: Secondary | ICD-10-CM | POA: Diagnosis not present

## 2023-03-28 DIAGNOSIS — F411 Generalized anxiety disorder: Secondary | ICD-10-CM | POA: Diagnosis not present

## 2023-04-10 DIAGNOSIS — F411 Generalized anxiety disorder: Secondary | ICD-10-CM | POA: Diagnosis not present

## 2023-04-11 DIAGNOSIS — F411 Generalized anxiety disorder: Secondary | ICD-10-CM | POA: Diagnosis not present

## 2023-04-18 DIAGNOSIS — F411 Generalized anxiety disorder: Secondary | ICD-10-CM | POA: Diagnosis not present

## 2023-05-02 DIAGNOSIS — F411 Generalized anxiety disorder: Secondary | ICD-10-CM | POA: Diagnosis not present

## 2023-06-06 DIAGNOSIS — N951 Menopausal and female climacteric states: Secondary | ICD-10-CM | POA: Diagnosis not present

## 2023-06-06 DIAGNOSIS — F32A Depression, unspecified: Secondary | ICD-10-CM | POA: Diagnosis not present

## 2023-06-06 DIAGNOSIS — G4700 Insomnia, unspecified: Secondary | ICD-10-CM | POA: Diagnosis not present

## 2023-06-06 DIAGNOSIS — F419 Anxiety disorder, unspecified: Secondary | ICD-10-CM | POA: Diagnosis not present

## 2023-06-06 DIAGNOSIS — F411 Generalized anxiety disorder: Secondary | ICD-10-CM | POA: Diagnosis not present

## 2023-06-20 DIAGNOSIS — F5104 Psychophysiologic insomnia: Secondary | ICD-10-CM | POA: Diagnosis not present

## 2023-06-20 DIAGNOSIS — M79621 Pain in right upper arm: Secondary | ICD-10-CM | POA: Diagnosis not present

## 2023-06-20 DIAGNOSIS — Z79899 Other long term (current) drug therapy: Secondary | ICD-10-CM | POA: Diagnosis not present

## 2023-06-20 DIAGNOSIS — N644 Mastodynia: Secondary | ICD-10-CM | POA: Diagnosis not present

## 2023-06-20 DIAGNOSIS — R0683 Snoring: Secondary | ICD-10-CM | POA: Diagnosis not present

## 2023-06-27 DIAGNOSIS — F411 Generalized anxiety disorder: Secondary | ICD-10-CM | POA: Diagnosis not present

## 2023-07-11 NOTE — Progress Notes (Deleted)
52 y.o. G69P0001 Married [place] female here for NEW GYN/annual exam.    PCP:     No LMP recorded. (Menstrual status: Perimenopausal).           Sexually active: {yes no:314532}  The current method of family planning is none.    Exercising: {yes no:314532}  {types:19826} Smoker:  no  Health Maintenance: Pap:  01-25-18 neg HPV HR neg  History of abnormal Pap:  no MMG:  06/29/20 Breast Density Cat C, BI-RADS CAT 2 benign Colonoscopy:  n/a BMD:   n/a  Result  n/a TDaP:  05/14/18 Gardasil:   no HIV: neg in preg Hep C: unsure Screening Labs:  Hb today: ***, Urine today: ***   reports that she has never smoked. She has never used smokeless tobacco. She reports current alcohol use of about 1.0 standard drink of alcohol per week. She reports that she does not use drugs.  Past Medical History:  Diagnosis Date   Anxiety    Endometriosis    Hormone disorder    Infertility, female    Kidney stones    Mitral valve prolapse     Past Surgical History:  Procedure Laterality Date   CESAREAN SECTION     CYSTOSCOPY W/ URETERAL STENT PLACEMENT Left 01/16/2017   Procedure: CYSTOSCOPY WITH RETROGRADE PYELOGRAM/URETEROSCOPY/ LASER LITHOTRIPSY/URETERAL STENT PLACEMENT;  Surgeon: Heloise Purpura, MD;  Location: [facility];  Service: Urology;  Laterality: Left;   Laparoscopic for endometriosis     uterus, ovary, and intestine    Current Outpatient Medications  Medication Sig Dispense Refill   Black Cohosh 40 MG CAPS Take 1 capsule by mouth daily.     cetirizine (ZYRTEC) 10 MG tablet Take by mouth.     cholecalciferol (VITAMIN D) 1000 units tablet Take 1,000 Units by mouth daily.     Digestive Enzymes (DIGESTIVE ENZYME PO) Take 1 tablet by mouth daily.     Estradiol 10 MCG TABS vaginal tablet Vagifem generic 10 mcg.  Place one tablet per vagina at hs x 2 weeks.  Then place one tablet per vagina at hs twice a week at hs.  Disp:  34 for first Rx and then 3 refills of 24 tablets 34 tablet 3   FLUoxetine  (PROZAC) 20 MG capsule Take by mouth.     FLUoxetine HCl (PROZAC PO) Take 40 mg by mouth daily.      Maca Root (MACA PO) Take 1 tablet by mouth daily.     medroxyPROGESTERone (PROVERA) 10 MG tablet Take 1 tablet (10 mg total) by mouth daily. 10 tablet 0   Melatonin 10 MG CAPS Take 1 tablet by mouth at bedtime.     Multiple Vitamins-Minerals (ONE-A-DAY WOMENS 50 PLUS PO) Take 1 capsule by mouth daily.     QUERCETIN PO Take 1 tablet by mouth daily.     traZODone (DESYREL) 50 MG tablet Take 50 mg by mouth at bedtime.     TRINTELLIX 5 MG TABS tablet Take 5 mg by mouth daily.     vitamin B-12 (CYANOCOBALAMIN) 500 MCG tablet Take by mouth.     No current facility-administered medications for this visit.    Family History  Problem Relation Age of Onset   Hypertension Mother    Anxiety disorder Mother    Gallstones Sister    Alzheimer's disease Maternal Grandmother    Heart Problems Maternal Grandmother    Stroke Maternal Grandmother    Alzheimer's disease Maternal Grandfather    Alzheimer's disease Paternal Grandmother  Alzheimer's disease Paternal Grandfather     Review of Systems  Exam:   There were no vitals taken for this visit.    General appearance: alert, cooperative and appears stated age Head: normocephalic, without obvious abnormality, atraumatic Neck: no adenopathy, supple, symmetrical, trachea midline and thyroid normal to inspection and palpation Lungs: clear to auscultation bilaterally Breasts: normal appearance, no masses or tenderness, No nipple retraction or dimpling, No nipple discharge or bleeding, No axillary adenopathy Heart: regular rate and rhythm Abdomen: soft, non-tender; no masses, no organomegaly Extremities: extremities normal, atraumatic, no cyanosis or edema Skin: skin color, texture, turgor normal. No rashes or lesions Lymph nodes: cervical, supraclavicular, and axillary nodes normal. Neurologic: grossly normal  Pelvic: External genitalia:  no  lesions              No abnormal inguinal nodes palpated.              Urethra:  normal appearing urethra with no masses, tenderness or lesions              Bartholins and Skenes: normal                 Vagina: normal appearing vagina with normal color and discharge, no lesions              Cervix: no lesions              Pap taken: {yes no:314532} Bimanual Exam:  Uterus:  normal size, contour, position, consistency, mobility, non-tender              Adnexa: no mass, fullness, tenderness              Rectal exam: {yes no:314532}.  Confirms.              Anus:  normal sphincter tone, no lesions  Chaperone was present for exam:  ***  Assessment:   Well woman visit with gynecologic exam.   Plan: Mammogram screening discussed. Self breast awareness reviewed. Pap and HR HPV as above. Guidelines for Calcium, Vitamin D, regular exercise program including cardiovascular and weight bearing exercise.   Follow up annually and prn.   Additional counseling given.  {yes [id number]. _______ minutes face to face time of which over 50% was spent in counseling.    After visit summary provided.

## 2023-07-13 DIAGNOSIS — F5104 Psychophysiologic insomnia: Secondary | ICD-10-CM | POA: Diagnosis not present

## 2023-07-13 DIAGNOSIS — G4730 Sleep apnea, unspecified: Secondary | ICD-10-CM | POA: Diagnosis not present

## 2023-07-25 ENCOUNTER — Encounter: Payer: BC Managed Care – PPO | Admitting: Obstetrics and Gynecology

## 2023-07-25 DIAGNOSIS — M545 Low back pain, unspecified: Secondary | ICD-10-CM | POA: Diagnosis not present

## 2023-08-01 DIAGNOSIS — F411 Generalized anxiety disorder: Secondary | ICD-10-CM | POA: Diagnosis not present

## 2023-08-02 DIAGNOSIS — R0683 Snoring: Secondary | ICD-10-CM | POA: Diagnosis not present

## 2023-08-02 DIAGNOSIS — G4710 Hypersomnia, unspecified: Secondary | ICD-10-CM | POA: Diagnosis not present

## 2023-08-03 DIAGNOSIS — R0683 Snoring: Secondary | ICD-10-CM | POA: Diagnosis not present

## 2023-08-03 DIAGNOSIS — G4710 Hypersomnia, unspecified: Secondary | ICD-10-CM | POA: Diagnosis not present

## 2023-08-11 DIAGNOSIS — N644 Mastodynia: Secondary | ICD-10-CM | POA: Diagnosis not present

## 2023-08-11 DIAGNOSIS — R92333 Mammographic heterogeneous density, bilateral breasts: Secondary | ICD-10-CM | POA: Diagnosis not present

## 2023-08-11 DIAGNOSIS — R920 Mammographic microcalcification found on diagnostic imaging of breast: Secondary | ICD-10-CM | POA: Diagnosis not present

## 2023-09-02 DIAGNOSIS — G4709 Other insomnia: Secondary | ICD-10-CM | POA: Diagnosis not present

## 2023-09-02 DIAGNOSIS — H00011 Hordeolum externum right upper eyelid: Secondary | ICD-10-CM | POA: Diagnosis not present

## 2023-09-13 DIAGNOSIS — G4700 Insomnia, unspecified: Secondary | ICD-10-CM | POA: Diagnosis not present

## 2023-09-13 DIAGNOSIS — F419 Anxiety disorder, unspecified: Secondary | ICD-10-CM | POA: Diagnosis not present

## 2023-09-13 NOTE — Progress Notes (Signed)
GYNECOLOGY  VISIT   HPI: 52 y.o.   Married  Caucasian Hispanic female   G1P0001 with Patient's last menstrual period was 11/07/2021.   here for: RETURNING NEW GYN/ menopausal symptoms- wants to discuss HRT. Struggling with sleep.    Has hot flashes during the day and feels heat at night also.  Not sweating.  Up infrequently at night to use bathroom.  Memory affected.  FH Alzheimer's.  Had evaluation in [place] and no dx of cognitive decline. Has dryness of the vagina.  Used vaginal estrogen tablets in the past, and considering restarting.   Taking Trintellix for depression and Ambien for sleep. Took Seroquel and Trazodone in past.    Doing therapy with a psychologist and psychiatrist.  Did sleep study.   Does not have apnea.  Used COCs and HRT and in the past and had leg pain. No DVT.   FSH 33.7 and estradiol 29.2 on 06/25/20.   Moved back to the area from [place]. Back in school.  PCP - Dr. [facility] - in [place]  GYNECOLOGIC HISTORY: Patient's last menstrual period was 11/07/2021. Contraception:  PMP Menopausal hormone therapy:  n/a Pap:      Component Value Date/Time   DIAGPAP  01/25/2018 0000    NEGATIVE FOR INTRAEPITHELIAL LESIONS OR MALIGNANCY.   ADEQPAP  01/25/2018 0000    Satisfactory for evaluation  endocervical/transformation zone component PRESENT.   History of abnormal Pap or positive HPV:  no Mammogram:  [facility]) BI-RADS CAT 2 benign - bilateral dx - due to right breast pain.         OB History     Gravida  1   Para  1   Term  0   Preterm  0   AB  0   Living  1      SAB  0   IAB  0   Ectopic  0   Multiple  0   Live Births  0              Patient Active Problem List   Diagnosis Date Noted   Decreased libido 02/15/2018   Perimenopausal vasomotor symptoms 01/25/2018   Overweight 01/12/2017   Mixed emotional features as adjustment reaction 01/10/2017   Recurrent nephrolithiasis 12/17/2015    Past  Medical History:  Diagnosis Date   Anxiety    Endometriosis    Hormone disorder    Infertility, female    Kidney stones    Mitral valve prolapse     Past Surgical History:  Procedure Laterality Date   CESAREAN SECTION     CYSTOSCOPY W/ URETERAL STENT PLACEMENT Left 01/16/2017   Procedure: CYSTOSCOPY WITH RETROGRADE PYELOGRAM/URETEROSCOPY/ LASER LITHOTRIPSY/URETERAL STENT PLACEMENT;  Surgeon: Heloise Purpura, MD;  Location: [facility];  Service: Urology;  Laterality: Left;   Laparoscopic for endometriosis     uterus, ovary, and intestine    Current Outpatient Medications  Medication Sig Dispense Refill   Black Cohosh 40 MG CAPS Take 1 capsule by mouth daily.     cholecalciferol (VITAMIN D) 1000 units tablet Take 1,000 Units by mouth daily.     Digestive Enzymes (DIGESTIVE ENZYME PO) Take 1 tablet by mouth daily.     Maca Root (MACA PO) Take 1 tablet by mouth daily.     Melatonin 10 MG CAPS Take 1 tablet by mouth at bedtime.     Multiple Vitamins-Minerals (ONE-A-DAY WOMENS 50 PLUS PO) Take 1 capsule by mouth daily.     TRINTELLIX 5  MG TABS tablet Take 5 mg by mouth daily.     vitamin B-12 (CYANOCOBALAMIN) 500 MCG tablet Take by mouth.     zolpidem (AMBIEN CR) 12.5 MG CR tablet Take 12.5 mg by mouth at bedtime as needed for sleep.     No current facility-administered medications for this visit.     ALLERGIES: Temazepam  Family History  Problem Relation Age of Onset   Hypertension Mother    Anxiety disorder Mother    Gallstones Sister    Alzheimer's disease Maternal Grandmother    Heart Problems Maternal Grandmother    Stroke Maternal Grandmother    Alzheimer's disease Maternal Grandfather    Alzheimer's disease Paternal Grandmother    Alzheimer's disease Paternal Grandfather     Social History   Socioeconomic History   Marital status: Married    Spouse name: Not on file   Number of children: Not on file   Years of education: Not on file   Highest education level: Not on  file  Occupational History   Not on file  Tobacco Use   Smoking status: Never   Smokeless tobacco: Never  Vaping Use   Vaping status: Never Used  Substance and Sexual Activity   Alcohol use: Yes    Alcohol/week: 1.0 standard drink of alcohol    Types: 1 Standard drinks or equivalent per week   Drug use: No   Sexual activity: Yes    Partners: Male  Other Topics Concern   Not on file  Social History Narrative   Not on file   Social Determinants of Health   Financial Resource Strain: Not on file  Food Insecurity: Low Risk  (06/20/2023)   Received from [facility]   Hunger Vital Sign    Worried About Running Out of Food in the Last Year: Never true    Ran Out of Food in the Last Year: Never true  Transportation Needs: Not on file (06/20/2023)  Physical Activity: Not on file  Stress: Not on file  Social Connections: Not on file  Intimate Partner Violence: Not on file    Review of Systems  All other systems reviewed and are negative.   PHYSICAL EXAMINATION:   BP 118/70 (BP Location: Right Arm, Patient Position: Sitting, Cuff Size: Normal)   Ht 5' 9.5" (1.765 m)   Wt 201 lb (91.2 kg)   LMP 11/07/2021   BMI 29.26 kg/m     General appearance: alert, cooperative and appears stated age  ASSESSMENT:  Menopausal symptoms.  Insomnia.  Has had evaluation and has had various treatment options.  Leg pain with COC use and HRT use in the past.   PLAN:  Check FSH and estradiol today.  Discused WHI and use of HRT which can increase risk of PE, DVT, MI, stroke and breast cancer.  We discussed HRT low dose transdermal patch to lower potential side effects of leg pain and Prometrium 100 mg q hs to help with sleep.  FU in about 7 weeks for annual and medication follow up.   35 min  total time was spent for this patient encounter, including preparation, face-to-face counseling with the patient, coordination of care, and documentation of the encounter.  An After Visit Summary was  provided to the patient.

## 2023-09-14 DIAGNOSIS — N951 Menopausal and female climacteric states: Secondary | ICD-10-CM | POA: Diagnosis not present

## 2023-09-14 DIAGNOSIS — G4709 Other insomnia: Secondary | ICD-10-CM | POA: Diagnosis not present

## 2023-09-14 DIAGNOSIS — H0100A Unspecified blepharitis right eye, upper and lower eyelids: Secondary | ICD-10-CM | POA: Diagnosis not present

## 2023-09-27 ENCOUNTER — Ambulatory Visit (INDEPENDENT_AMBULATORY_CARE_PROVIDER_SITE_OTHER): Payer: BC Managed Care – PPO | Admitting: Obstetrics and Gynecology

## 2023-09-27 ENCOUNTER — Encounter: Payer: Self-pay | Admitting: Obstetrics and Gynecology

## 2023-09-27 VITALS — BP 118/70 | Ht 69.5 in | Wt 201.0 lb

## 2023-09-27 DIAGNOSIS — N951 Menopausal and female climacteric states: Secondary | ICD-10-CM | POA: Diagnosis not present

## 2023-09-27 MED ORDER — PROGESTERONE MICRONIZED 100 MG PO CAPS: 100.0000 mg | ORAL_CAPSULE | Freq: Every day | ORAL | 2 refills | Status: DC

## 2023-09-27 MED ORDER — ESTRADIOL 0.025 MG/24HR TD PTTW: 1.0000 | MEDICATED_PATCH | TRANSDERMAL | 2 refills | Status: DC

## 2023-09-27 NOTE — Patient Instructions (Signed)

## 2023-09-28 LAB — ESTRADIOL: Estradiol: 19 pg/mL

## 2023-09-28 LAB — FOLLICLE STIMULATING HORMONE: FSH: 66.3 m[IU]/mL

## 2023-10-19 DIAGNOSIS — F411 Generalized anxiety disorder: Secondary | ICD-10-CM | POA: Diagnosis not present

## 2023-10-24 DIAGNOSIS — J069 Acute upper respiratory infection, unspecified: Secondary | ICD-10-CM | POA: Diagnosis not present

## 2023-10-26 ENCOUNTER — Encounter: Payer: Self-pay | Admitting: Obstetrics and Gynecology

## 2023-10-26 DIAGNOSIS — F411 Generalized anxiety disorder: Secondary | ICD-10-CM | POA: Diagnosis not present

## 2023-10-26 NOTE — Progress Notes (Deleted)
GYNECOLOGY  VISIT   HPI: 52 y.o.   Married  [place] female   G1P0001 with Patient's last menstrual period was 11/07/2021.   here for: 7 week HRT f/u     GYNECOLOGIC HISTORY: Patient's last menstrual period was 11/07/2021. Contraception:  PMP Menopausal hormone therapy:  n/a Last 2 paps:  01/25/18 neg: HR HPV neg History of abnormal Pap or positive HPV:  no Mammogram:  08/11/23 ([facility]) BI-RADS CAT 2 benign - bilateral dx - due to right breast pain.                OB History     Gravida  1   Para  1   Term  0   Preterm  0   AB  0   Living  1      SAB  0   IAB  0   Ectopic  0   Multiple  0   Live Births  0              Patient Active Problem List   Diagnosis Date Noted   Decreased libido 02/15/2018   Perimenopausal vasomotor symptoms 01/25/2018   Overweight 01/12/2017   Mixed emotional features as adjustment reaction 01/10/2017   Recurrent nephrolithiasis 12/17/2015    Past Medical History:  Diagnosis Date   Anxiety    Endometriosis    Hormone disorder    Infertility, female    Kidney stones    Mitral valve prolapse     Past Surgical History:  Procedure Laterality Date   CESAREAN SECTION     CYSTOSCOPY W/ URETERAL STENT PLACEMENT Left 01/16/2017   Procedure: CYSTOSCOPY WITH RETROGRADE PYELOGRAM/URETEROSCOPY/ LASER LITHOTRIPSY/URETERAL STENT PLACEMENT;  Surgeon: Heloise Purpura, MD;  Location: [facility];  Service: Urology;  Laterality: Left;   Laparoscopic for endometriosis     uterus, ovary, and intestine    Current Outpatient Medications  Medication Sig Dispense Refill   Black Cohosh 40 MG CAPS Take 1 capsule by mouth daily.     cholecalciferol (VITAMIN D) 1000 units tablet Take 1,000 Units by mouth daily.     Digestive Enzymes (DIGESTIVE ENZYME PO) Take 1 tablet by mouth daily.     estradiol (VIVELLE-DOT) 0.025 MG/24HR Place 1 patch onto the skin 2 (two) times a week. 23 patch 2   Maca Root (MACA PO) Take 1 tablet by mouth daily.      Melatonin 10 MG CAPS Take 1 tablet by mouth at bedtime.     Multiple Vitamins-Minerals (ONE-A-DAY WOMENS 50 PLUS PO) Take 1 capsule by mouth daily.     progesterone (PROMETRIUM) 100 MG capsule Take 1 capsule (100 mg total) by mouth daily. 30 capsule 2   TRINTELLIX 5 MG TABS tablet Take 5 mg by mouth daily.     vitamin B-12 (CYANOCOBALAMIN) 500 MCG tablet Take by mouth.     zolpidem (AMBIEN CR) 12.5 MG CR tablet Take 12.5 mg by mouth at bedtime as needed for sleep.     No current facility-administered medications for this visit.     ALLERGIES: Temazepam  Family History  Problem Relation Age of Onset   Hypertension Mother    Anxiety disorder Mother    Gallstones Sister    Alzheimer's disease Maternal Grandmother    Heart Problems Maternal Grandmother    Stroke Maternal Grandmother    Alzheimer's disease Maternal Grandfather    Alzheimer's disease Paternal Grandmother    Alzheimer's disease Paternal Grandfather     Social History  Socioeconomic History   Marital status: Married    Spouse name: Not on file   Number of children: Not on file   Years of education: Not on file   Highest education level: Not on file  Occupational History   Not on file  Tobacco Use   Smoking status: Never   Smokeless tobacco: Never  Vaping Use   Vaping status: Never Used  Substance and Sexual Activity   Alcohol use: Yes    Alcohol/week: 1.0 standard drink of alcohol    Types: 1 Standard drinks or equivalent per week   Drug use: No   Sexual activity: Yes    Partners: Male  Other Topics Concern   Not on file  Social History Narrative   Not on file   Social Drivers of Health   Financial Resource Strain: Not on file  Food Insecurity: Low Risk  (06/20/2023)   Received from [facility]   Hunger Vital Sign    Worried About Running Out of Food in the Last Year: Never true    Ran Out of Food in the Last Year: Never true  Transportation Needs: Not on file (06/20/2023)  Physical Activity: Not  on file  Stress: Not on file  Social Connections: Not on file  Intimate Partner Violence: Not on file    Review of Systems  PHYSICAL EXAMINATION:   LMP 11/07/2021     General appearance: alert, cooperative and appears stated age Head: Normocephalic, without obvious abnormality, atraumatic Neck: no adenopathy, supple, symmetrical, trachea midline and thyroid normal to inspection and palpation Lungs: clear to auscultation bilaterally Breasts: normal appearance, no masses or tenderness, No nipple retraction or dimpling, No nipple discharge or bleeding, No axillary or supraclavicular adenopathy Heart: regular rate and rhythm Abdomen: soft, non-tender, no masses,  no organomegaly Extremities: extremities normal, atraumatic, no cyanosis or edema Skin: Skin color, texture, turgor normal. No rashes or lesions Lymph nodes: Cervical, supraclavicular, and axillary nodes normal. No abnormal inguinal nodes palpated Neurologic: Grossly normal  Pelvic: External genitalia:  no lesions              Urethra:  normal appearing urethra with no masses, tenderness or lesions              Bartholins and Skenes: normal                 Vagina: normal appearing vagina with normal color and discharge, no lesions              Cervix: no lesions                Bimanual Exam:  Uterus:  normal size, contour, position, consistency, mobility, non-tender              Adnexa: no mass, fullness, tenderness              Rectal exam: {yes no:314532}.  Confirms.              Anus:  normal sphincter tone, no lesions  Chaperone was present for exam:  {BSCHAPERONE:31226::"[employer] F, CMA"}  ASSESSMENT:    PLAN:    {LABS (Optional):23779}  ***  total time was spent for this patient encounter, including preparation, face-to-face counseling with the patient, coordination of care, and documentation of the encounter.

## 2023-11-09 ENCOUNTER — Ambulatory Visit: Payer: BC Managed Care – PPO | Admitting: Obstetrics and Gynecology

## 2023-11-14 DIAGNOSIS — E538 Deficiency of other specified B group vitamins: Secondary | ICD-10-CM | POA: Diagnosis not present

## 2023-11-14 DIAGNOSIS — Z13220 Encounter for screening for lipoid disorders: Secondary | ICD-10-CM | POA: Diagnosis not present

## 2023-11-14 DIAGNOSIS — Z23 Encounter for immunization: Secondary | ICD-10-CM | POA: Diagnosis not present

## 2023-11-14 DIAGNOSIS — F5104 Psychophysiologic insomnia: Secondary | ICD-10-CM | POA: Diagnosis not present

## 2023-11-15 DIAGNOSIS — E559 Vitamin D deficiency, unspecified: Secondary | ICD-10-CM | POA: Diagnosis not present

## 2023-11-15 DIAGNOSIS — F5104 Psychophysiologic insomnia: Secondary | ICD-10-CM | POA: Diagnosis not present

## 2023-11-15 DIAGNOSIS — Z13220 Encounter for screening for lipoid disorders: Secondary | ICD-10-CM | POA: Diagnosis not present

## 2023-11-15 DIAGNOSIS — Z0184 Encounter for antibody response examination: Secondary | ICD-10-CM | POA: Diagnosis not present

## 2023-11-15 DIAGNOSIS — E538 Deficiency of other specified B group vitamins: Secondary | ICD-10-CM | POA: Diagnosis not present

## 2023-11-15 DIAGNOSIS — Z114 Encounter for screening for human immunodeficiency virus [HIV]: Secondary | ICD-10-CM | POA: Diagnosis not present

## 2023-11-15 DIAGNOSIS — Z1159 Encounter for screening for other viral diseases: Secondary | ICD-10-CM | POA: Diagnosis not present

## 2023-11-15 LAB — HM HEPATITIS C SCREENING LAB: HM Hepatitis Screen: NEGATIVE

## 2023-11-23 DIAGNOSIS — N200 Calculus of kidney: Secondary | ICD-10-CM | POA: Diagnosis not present

## 2023-11-30 DIAGNOSIS — F411 Generalized anxiety disorder: Secondary | ICD-10-CM | POA: Diagnosis not present

## 2023-12-07 DIAGNOSIS — F411 Generalized anxiety disorder: Secondary | ICD-10-CM | POA: Diagnosis not present

## 2023-12-08 ENCOUNTER — Encounter: Payer: Self-pay | Admitting: Obstetrics and Gynecology

## 2023-12-08 DIAGNOSIS — M25562 Pain in left knee: Secondary | ICD-10-CM | POA: Diagnosis not present

## 2023-12-11 NOTE — Telephone Encounter (Signed)
Call placed to patient to further assess. Left message to call Noreene Larsson, RN at [facility], [phone], option 5.

## 2023-12-11 NOTE — Telephone Encounter (Signed)
Spoke with patient. Patient reports symptoms have resolved. States she spoke with provider treating her knee. Patient appreciative of call, is aware to call if any concerns.   Routing to provider for final review. Patient is agreeable to disposition. Will close encounter.

## 2023-12-14 DIAGNOSIS — F411 Generalized anxiety disorder: Secondary | ICD-10-CM | POA: Diagnosis not present

## 2023-12-15 DIAGNOSIS — N200 Calculus of kidney: Secondary | ICD-10-CM | POA: Diagnosis not present

## 2023-12-18 NOTE — Telephone Encounter (Signed)
 Msg sent to appt desk.

## 2023-12-18 NOTE — Progress Notes (Signed)
GYNECOLOGY  VISIT   HPI: 53 y.o.   Married  [place] female   G1P0001 with Patient's last menstrual period was 11/07/2021.   here for: PMB with discontinued HRT. Pt noticed last Thursday. Feels like menstrual cycle.  Some cramping.  Still currently bleeding.    Started HRT after visit 09/27/23 due to hot flashes.   She developed breast tenderness, cold hands and feet and bilateral leg pain.   Also fell and hurt her left knee and saw her provider for this.   Stopped taking the hormone therapy 12/15/23.  Her bilateral leg pain is much improved. Swelling in her legs is gone.   FSH 66.3 and estradiol 19 09/27/23.   GYNECOLOGIC HISTORY: Patient's last menstrual period was 11/07/2021. Contraception:  PMP Menopausal hormone therapy:  n/a Last 2 paps:  01/25/18 neg: HR HPV neg History of abnormal Pap or positive HPV:  no Mammogram:  [facility] ([facility]) BI-RADS CAT 2 benign - bilateral dx - due to right breast pain.         OB History     Gravida  1   Para  1   Term  0   Preterm  0   AB  0   Living  1      SAB  0   IAB  0   Ectopic  0   Multiple  0   Live Births  0              Patient Active Problem List   Diagnosis Date Noted   Decreased libido 02/15/2018   Perimenopausal vasomotor symptoms 01/25/2018   Overweight 01/12/2017   Mixed emotional features as adjustment reaction 01/10/2017   Recurrent nephrolithiasis 12/17/2015    Past Medical History:  Diagnosis Date   Anxiety    Endometriosis    Hormone disorder    Infertility, female    Kidney stones    Mitral valve prolapse     Past Surgical History:  Procedure Laterality Date   CESAREAN SECTION     CYSTOSCOPY W/ URETERAL STENT PLACEMENT Left 01/16/2017   Procedure: CYSTOSCOPY WITH RETROGRADE PYELOGRAM/URETEROSCOPY/ LASER LITHOTRIPSY/URETERAL STENT PLACEMENT;  Surgeon: Heloise Purpura, MD;  Location: [facility];  Service: Urology;  Laterality: Left;   Laparoscopic for endometriosis     uterus,  ovary, and intestine    Current Outpatient Medications  Medication Sig Dispense Refill   Black Cohosh 40 MG CAPS Take 1 capsule by mouth daily.     cholecalciferol (VITAMIN D) 1000 units tablet Take 1,000 Units by mouth daily.     Digestive Enzymes (DIGESTIVE ENZYME PO) Take 1 tablet by mouth daily.     hydrOXYzine (ATARAX) 50 MG tablet Take by mouth.     hydrOXYzine (VISTARIL) 25 MG capsule Take 25 mg by mouth daily as needed.     Maca Root (MACA PO) Take 1 tablet by mouth daily.     Melatonin 10 MG CAPS Take 1 tablet by mouth at bedtime.     Multiple Vitamins-Minerals (ONE-A-DAY WOMENS 50 PLUS PO) Take 1 capsule by mouth daily.     TRINTELLIX 5 MG TABS tablet Take 5 mg by mouth daily.     vitamin B-12 (CYANOCOBALAMIN) 500 MCG tablet Take by mouth.     zolpidem (AMBIEN CR) 12.5 MG CR tablet Take 12.5 mg by mouth at bedtime as needed for sleep.     estradiol (VIVELLE-DOT) 0.025 MG/24HR Place 1 patch onto the skin 2 (two) times a week. (Patient not taking: Reported  on 12/19/2023) 23 patch 2   progesterone (PROMETRIUM) 100 MG capsule Take 1 capsule (100 mg total) by mouth daily. (Patient not taking: Reported on 12/19/2023) 30 capsule 2   No current facility-administered medications for this visit.     ALLERGIES: Temazepam  Family History  Problem Relation Age of Onset   Hypertension Mother    Anxiety disorder Mother    Gallstones Sister    Alzheimer's disease Maternal Grandmother    Heart Problems Maternal Grandmother    Stroke Maternal Grandmother    Alzheimer's disease Maternal Grandfather    Alzheimer's disease Paternal Grandmother    Alzheimer's disease Paternal Grandfather     Social History   Socioeconomic History   Marital status: Married    Spouse name: Not on file   Number of children: Not on file   Years of education: Not on file   Highest education level: Not on file  Occupational History   Not on file  Tobacco Use   Smoking status: Never   Smokeless tobacco:  Never  Vaping Use   Vaping status: Never Used  Substance and Sexual Activity   Alcohol use: Yes    Alcohol/week: 1.0 standard drink of alcohol    Types: 1 Standard drinks or equivalent per week   Drug use: No   Sexual activity: Yes    Partners: Male  Other Topics Concern   Not on file  Social History Narrative   Not on file   Social Drivers of Health   Financial Resource Strain: Not on file  Food Insecurity: Low Risk  (06/20/2023)   Received from [facility]   Hunger Vital Sign    Worried About Running Out of Food in the Last Year: Never true    Ran Out of Food in the Last Year: Never true  Transportation Needs: Not on file (06/20/2023)  Physical Activity: Not on file  Stress: Not on file  Social Connections: Not on file  Intimate Partner Violence: Not on file    Review of Systems  All other systems reviewed and are negative.   PHYSICAL EXAMINATION:   BP 132/74 (BP Location: Right Arm, Patient Position: Sitting, Cuff Size: Small)   Ht 5' 9.5" (1.765 m)   Wt 204 lb (92.5 kg)   LMP 11/07/2021   BMI 29.69 kg/m     General appearance: alert, cooperative and appears stated age  Pelvic: External genitalia:  no lesions              Urethra:  normal appearing urethra with no masses, tenderness or lesions              Bartholins and Skenes: normal                 Vagina: normal appearing vagina with normal color and discharge, no lesions              Cervix: no lesions.  Blood coming from the cervix.                 Bimanual Exam:  Uterus:  normal size, contour, position, consistency, mobility, non-tender              Adnexa: no mass, fullness, tenderness         Chaperone was present for exam:  Warren Lacy, CMA  ASSESSMENT:  Postmenopausal bleeding.  HRT not well tolerated due to leg pain.    PLAN:  Postmenopausal bleeding discussed.  Etiologies:  HRT, polyp, precancer and cancer.  Will recheck FSH and estradiol. Return for pelvic ultrasound.  She also has an  appointment for her annual exam and will need a pap when she is not bleeding.   26 min  total time was spent for this patient encounter, including preparation, face-to-face counseling with the patient, coordination of care, and documentation of the encounter.

## 2023-12-18 NOTE — Telephone Encounter (Signed)
 Pt now scheduled for 12/19/2023. Encounter closed.

## 2023-12-18 NOTE — Telephone Encounter (Signed)
 Pt also LVM in triage line about an appt w/ BS for her bleeding.

## 2023-12-19 ENCOUNTER — Encounter: Payer: Self-pay | Admitting: Obstetrics and Gynecology

## 2023-12-19 ENCOUNTER — Ambulatory Visit: Payer: BC Managed Care – PPO | Admitting: Obstetrics and Gynecology

## 2023-12-19 VITALS — BP 132/74 | Ht 69.5 in | Wt 204.0 lb

## 2023-12-19 DIAGNOSIS — R6882 Decreased libido: Secondary | ICD-10-CM | POA: Diagnosis not present

## 2023-12-19 DIAGNOSIS — N950 Postmenopausal bleeding: Secondary | ICD-10-CM | POA: Diagnosis not present

## 2023-12-19 DIAGNOSIS — R102 Pelvic and perineal pain: Secondary | ICD-10-CM | POA: Diagnosis not present

## 2023-12-19 DIAGNOSIS — N951 Menopausal and female climacteric states: Secondary | ICD-10-CM | POA: Diagnosis not present

## 2023-12-19 NOTE — Addendum Note (Signed)
Addended by: Ardell Isaacs, Debbe Bales E on: 12/19/2023 12:22 PM   Modules accepted: Orders

## 2023-12-19 NOTE — Patient Instructions (Signed)
Uterine Bleeding After Menopause: What It Means Menopause is the end of a female's fertile years. After menopause, your ovaries can no longer release an egg. You'll no longer be able to get pregnant, and you'll no longer get a period. Any type of bleeding after menopause should be checked by a health care provider, as this is not normal. Treatment will depend on the cause of the bleeding. What can cause bleeding after menopause? Your provider may do tests to find out why you're bleeding. You may have bleeding if: You take hormones to treat the symptoms of menopause. The lining of your uterus thins as a result of low estrogen. The lining of your uterus thickens as a result of too much estrogen. You have cancer in the uterus. You have growths in the uterus, such as: Polyps. Fibroids. Follow these instructions at home: Pay attention to any changes in your symptoms. Let your provider know about them. If told by your provider: Avoid using tampons and douches. Get regular pelvic exams, including Pap tests. Take iron supplements. Change your pads regularly. Take your medicines only as told. Contact a health care provider if: You have pain in your belly. You have headaches. You have a fever or chills. You feel faint or dizzy. Get help right away if: You pass large blood clots. You have heavy bleeding that needs more than 1 pad an hour and you've never had this before. This information is not intended to replace advice given to you by your health care provider. Make sure you discuss any questions you have with your health care provider. Document Revised: 09/05/2023 Document Reviewed: 07/03/2023 Elsevier Patient Education  2024 [employer].

## 2023-12-20 ENCOUNTER — Encounter: Payer: Self-pay | Admitting: Obstetrics and Gynecology

## 2023-12-20 LAB — FOLLICLE STIMULATING HORMONE: FSH: 56 m[IU]/mL

## 2023-12-20 LAB — ESTRADIOL: Estradiol: 40 pg/mL

## 2024-01-02 ENCOUNTER — Ambulatory Visit (INDEPENDENT_AMBULATORY_CARE_PROVIDER_SITE_OTHER): Payer: BC Managed Care – PPO

## 2024-01-02 DIAGNOSIS — N950 Postmenopausal bleeding: Secondary | ICD-10-CM | POA: Diagnosis not present

## 2024-01-02 NOTE — Progress Notes (Signed)
 53 y.o. G28P0001 Married [place] female here for annual exam.    Patient had postmenopausal bleeding on HRT.  She started this for vasomotor symptoms and not sleeping well.  Medication discontinued due to leg pain following a fall.  Stopped bleeding one month ago.   Findings: Pelvic US 01/02/24.  Uterus 6.98 x 4.46 x 3.60 cm.  Posterior intramural fibroid, 0.80 cm.  EMS 3.05 cm.  Symmetrical, no masses, avascular.  Left ovary 1.55 x 1.21 x 0.96 cm.  Right ovary 1.72 x 1.50 x 1.10 cm.  No adnexal masses.  No free fluid.    Went to the ER 01/07/24 for a viral infection.  She was told dx was norovirus.  Negative for flu and Covid.  Had bilateral doppler US done 01/07/24 for bilateral LE swelling for one month:  no DVT. She will see her PCP in follow up to this ER visit.    Dad passed away since she was here one month ago.  Had lymphoma.  She did not have a close relationship with him recently.  Feels exhausted at times.  Not suicidal.   She has just started counseling.   Seen at [facility] yesterday for her insomnia and anxiety.  Gabapentin was prescribed.  She is on Trintellix.  Follow up was recommended in 2 weeks.   PCP: Delfina Redwood, FNP   Patient's last menstrual period was 11/07/2021.           Sexually active: Yes.    The current method of family planning is post menopausal status.    Menopausal hormone therapy:  n/a Exercising: Yes.     biking Smoker:  no  OB History  Gravida Para Term Preterm AB Living  1 1 0 0 0 1  SAB IAB Ectopic Multiple Live Births  0 0 0 0 0    # Outcome Date GA Lbr Len/2nd Weight Sex Type Anes PTL Lv  1 Para     F CS-Unspec        HEALTH MAINTENANCE: Last 2 paps:  01/25/18 neg: HR HPV neg  History of abnormal Pap or positive HPV:  no Mammogram:   08/11/23 ([facility]) BI-RADS CAT 2 benign - bilateral dx - due to right breast pain. Right breast calcifications.  Due in Oct. 2025.  Colonoscopy:   2023 - [place]  Bone  Density:  n/a  Result  n/a   Immunization History  Administered Date(s) Administered   Influenza-Unspecified 09/07/2016, 09/07/2016, 08/05/2019   PFIZER(Purple Top)SARS-COV-2 Vaccination 01/27/2020, 02/17/2020   Td 05/14/2018      reports that she has never smoked. She has never used smokeless tobacco. She reports current alcohol use of about 1.0 standard drink of alcohol per week. She reports that she does not use drugs.  Past Medical History:  Diagnosis Date   Anxiety    Endometriosis    Hormone disorder    Infertility, female    Kidney stones    Mitral valve prolapse     Past Surgical History:  Procedure Laterality Date   CESAREAN SECTION     CYSTOSCOPY W/ URETERAL STENT PLACEMENT Left 01/16/2017   Procedure: CYSTOSCOPY WITH RETROGRADE PYELOGRAM/URETEROSCOPY/ LASER LITHOTRIPSY/URETERAL STENT PLACEMENT;  Surgeon: Heloise Purpura, MD;  Location: [facility];  Service: Urology;  Laterality: Left;   Laparoscopic for endometriosis     uterus, ovary, and intestine    Current Outpatient Medications  Medication Sig Dispense Refill   Black Cohosh 40 MG CAPS Take 1 capsule by mouth daily.  cholecalciferol (VITAMIN D) 1000 units tablet Take 1,000 Units by mouth daily.     Digestive Enzymes (DIGESTIVE ENZYME PO) Take 1 tablet by mouth daily.     gabapentin (NEURONTIN) 300 MG capsule Take by mouth.     Maca Root (MACA PO) Take 1 tablet by mouth daily.     Melatonin 10 MG CAPS Take 1 tablet by mouth at bedtime.     Multiple Vitamins-Minerals (ONE-A-DAY WOMENS 50 PLUS PO) Take 1 capsule by mouth daily.     ondansetron (ZOFRAN-ODT) 4 MG disintegrating tablet 4mg  ODT q4 hours prn nausea/vomit 10 tablet 0   TRINTELLIX 5 MG TABS tablet Take 5 mg by mouth daily.     vitamin B-12 (CYANOCOBALAMIN) 500 MCG tablet Take by mouth.     zolpidem (AMBIEN CR) 12.5 MG CR tablet Take 12.5 mg by mouth at bedtime as needed for sleep.     No current facility-administered medications for this visit.     ALLERGIES: Temazepam  Family History  Problem Relation Age of Onset   Hypertension Mother    Anxiety disorder Mother    Gallstones Sister    Alzheimer's disease Maternal Grandmother    Heart Problems Maternal Grandmother    Stroke Maternal Grandmother    Alzheimer's disease Maternal Grandfather    Alzheimer's disease Paternal Grandmother    Alzheimer's disease Paternal Grandfather     Review of Systems  All other systems reviewed and are negative.   PHYSICAL EXAM:  BP 126/72 (BP Location: Right Arm, Patient Position: Sitting, Cuff Size: Small)   Pulse 88   Ht 5' 10.5" (1.791 m)   Wt 203 lb (92.1 kg)   LMP 11/07/2021   SpO2 98%   BMI 28.72 kg/m     General appearance: alert, cooperative and appears stated age Head: normocephalic, without obvious abnormality, atraumatic Neck: no adenopathy, supple, symmetrical, trachea midline and thyroid normal to inspection and palpation Lungs: clear to auscultation bilaterally Breasts: normal appearance, no masses or tenderness, No nipple retraction or dimpling, No nipple discharge or bleeding, No axillary adenopathy Heart: regular rate and rhythm Abdomen: soft, non-tender; no masses, no organomegaly Extremities: extremities normal, atraumatic, no cyanosis or edema Skin: skin color, texture, turgor normal. No rashes or lesions Lymph nodes: cervical, supraclavicular, and axillary nodes normal. Neurologic: grossly normal  Pelvic: External genitalia:  no lesions              No abnormal inguinal nodes palpated.              Urethra:  normal appearing urethra with no masses, tenderness or lesions              Bartholins and Skenes: normal                 Vagina: normal appearing vagina with normal color and discharge, no lesions              Cervix: no lesions              Pap taken: Yes.   Bimanual Exam:  Uterus:  normal size, contour, position, consistency, mobility, non-tender              Adnexa: no mass, fullness, tenderness               Rectal exam: Yes.  .  Confirms.              Anus:  normal sphincter tone, no lesions  Chaperone was present for exam:  Warren Lacy, CMA  ASSESSMENT: Well woman visit with gynecologic exam. Postmenopausal bleeding, stopped with discontinuation of HRT.  Small uterine fibroid on pelvic US. HRT not tolerated.  Negative doppler of LEs.  Bereavment.  Recent loss of her father.  PHQ-9: 14.  PLAN: Mammogram screening discussed. Self breast awareness reviewed. Pap and HRV collected:  yes Pelvic US images and report from 01/02/24 reviewed.  Guidelines for Calcium, Vitamin D, regular exercise program including cardiovascular and weight bearing exercise. Medication refills:  NA Labs with PCP.  Support give for the loss of her father.  I recommend [facility] and [facility] for counseling in addition to her seeing her routine counselor.   Follow up:  yearly and prn.

## 2024-01-05 DIAGNOSIS — F411 Generalized anxiety disorder: Secondary | ICD-10-CM | POA: Diagnosis not present

## 2024-01-06 ENCOUNTER — Other Ambulatory Visit: Payer: Self-pay

## 2024-01-06 ENCOUNTER — Encounter (HOSPITAL_BASED_OUTPATIENT_CLINIC_OR_DEPARTMENT_OTHER): Payer: Self-pay | Admitting: Emergency Medicine

## 2024-01-06 ENCOUNTER — Emergency Department (HOSPITAL_BASED_OUTPATIENT_CLINIC_OR_DEPARTMENT_OTHER)
Admission: EM | Admit: 2024-01-06 | Discharge: 2024-01-06 | Discharge status: Against Medical Advice | Payer: Self-pay | Attending: Emergency Medicine | Admitting: Emergency Medicine

## 2024-01-06 DIAGNOSIS — Z5321 Procedure and treatment not carried out due to patient leaving prior to being seen by health care provider: Secondary | ICD-10-CM | POA: Insufficient documentation

## 2024-01-06 DIAGNOSIS — M7989 Other specified soft tissue disorders: Secondary | ICD-10-CM | POA: Diagnosis not present

## 2024-01-06 NOTE — ED Triage Notes (Signed)
 Pt has been having bilateral leg swelling and pain since February 7.  Pt was on hormone replacement for several months and stopped using it once pain started.

## 2024-01-07 ENCOUNTER — Encounter (HOSPITAL_BASED_OUTPATIENT_CLINIC_OR_DEPARTMENT_OTHER): Payer: Self-pay

## 2024-01-07 ENCOUNTER — Other Ambulatory Visit: Payer: Self-pay

## 2024-01-07 ENCOUNTER — Emergency Department (HOSPITAL_BASED_OUTPATIENT_CLINIC_OR_DEPARTMENT_OTHER)

## 2024-01-07 ENCOUNTER — Emergency Department (HOSPITAL_BASED_OUTPATIENT_CLINIC_OR_DEPARTMENT_OTHER)
Admission: EM | Admit: 2024-01-07 | Discharge: 2024-01-07 | Disposition: A | Discharge status: Home / Self Care | Attending: Emergency Medicine | Admitting: Emergency Medicine

## 2024-01-07 DIAGNOSIS — M7989 Other specified soft tissue disorders: Secondary | ICD-10-CM | POA: Diagnosis not present

## 2024-01-07 DIAGNOSIS — R1010 Upper abdominal pain, unspecified: Secondary | ICD-10-CM | POA: Insufficient documentation

## 2024-01-07 DIAGNOSIS — R2243 Localized swelling, mass and lump, lower limb, bilateral: Secondary | ICD-10-CM | POA: Insufficient documentation

## 2024-01-07 DIAGNOSIS — M79605 Pain in left leg: Secondary | ICD-10-CM | POA: Diagnosis not present

## 2024-01-07 DIAGNOSIS — M79604 Pain in right leg: Secondary | ICD-10-CM | POA: Insufficient documentation

## 2024-01-07 DIAGNOSIS — R112 Nausea with vomiting, unspecified: Secondary | ICD-10-CM | POA: Insufficient documentation

## 2024-01-07 LAB — RESP PANEL BY RT-PCR (RSV, FLU A&B, COVID)  RVPGX2
Influenza A by PCR: NEGATIVE
Influenza B by PCR: NEGATIVE
Resp Syncytial Virus by PCR: NEGATIVE
SARS Coronavirus 2 by RT PCR: NEGATIVE

## 2024-01-07 LAB — URINALYSIS, W/ REFLEX TO CULTURE (INFECTION SUSPECTED)
Bilirubin Urine: NEGATIVE
Glucose, UA: NEGATIVE mg/dL
Ketones, ur: NEGATIVE mg/dL
Nitrite: NEGATIVE
Protein, ur: NEGATIVE mg/dL
Specific Gravity, Urine: >=1.03 (ref 1.005–1.030)
pH: 6 (ref 5.0–8.0)

## 2024-01-07 LAB — CBC WITH DIFFERENTIAL/PLATELET
Abs Immature Granulocytes: 0.05 10*3/uL (ref 0.00–0.07)
Basophils Absolute: 0 10*3/uL (ref 0.0–0.1)
Basophils Relative: 0 %
Eosinophils Absolute: 0.1 10*3/uL (ref 0.0–0.5)
Eosinophils Relative: 1 %
HCT: 47 % — ABNORMAL HIGH (ref 36.0–46.0)
Hemoglobin: 15.3 g/dL — ABNORMAL HIGH (ref 12.0–15.0)
Immature Granulocytes: 1 %
Lymphocytes Relative: 5 %
Lymphs Abs: 0.4 10*3/uL — ABNORMAL LOW (ref 0.7–4.0)
MCH: 28.1 pg (ref 26.0–34.0)
MCHC: 32.6 g/dL (ref 30.0–36.0)
MCV: 86.4 fL (ref 80.0–100.0)
Monocytes Absolute: 0.3 10*3/uL (ref 0.1–1.0)
Monocytes Relative: 3 %
Neutro Abs: 7.5 10*3/uL (ref 1.7–7.7)
Neutrophils Relative %: 90 %
Platelets: 207 10*3/uL (ref 150–400)
RBC: 5.44 MIL/uL — ABNORMAL HIGH (ref 3.87–5.11)
RDW: 13.8 % (ref 11.5–15.5)
WBC: 8.3 10*3/uL (ref 4.0–10.5)
nRBC: 0 % (ref 0.0–0.2)

## 2024-01-07 LAB — COMPREHENSIVE METABOLIC PANEL
ALT: 13 U/L (ref 0–44)
AST: 15 U/L (ref 15–41)
Albumin: 3.5 g/dL (ref 3.5–5.0)
Alkaline Phosphatase: 67 U/L (ref 38–126)
Anion gap: 9 (ref 5–15)
BUN: 15 mg/dL (ref 6–20)
CO2: 23 mmol/L (ref 22–32)
Calcium: 8.3 mg/dL — ABNORMAL LOW (ref 8.9–10.3)
Chloride: 105 mmol/L (ref 98–111)
Creatinine, Ser: 0.71 mg/dL (ref 0.44–1.00)
GFR, Estimated: >60 mL/min (ref >60)
Glucose, Bld: 101 mg/dL — ABNORMAL HIGH (ref 70–99)
Potassium: 4.3 mmol/L (ref 3.5–5.1)
Sodium: 137 mmol/L (ref 135–145)
Total Bilirubin: 0.7 mg/dL (ref 0.0–1.2)
Total Protein: 6.9 g/dL (ref 6.5–8.1)

## 2024-01-07 LAB — LIPASE, BLOOD: Lipase: 23 U/L (ref 11–51)

## 2024-01-07 MED ORDER — ACETAMINOPHEN 500 MG PO TABS
1000.0000 mg | ORAL_TABLET | Freq: Once | ORAL | Status: AC
  Administered 2024-01-07: 1000 mg via ORAL
  Filled 2024-01-07: qty 2

## 2024-01-07 MED ORDER — PANTOPRAZOLE SODIUM 40 MG IV SOLR
40.0000 mg | Freq: Once | INTRAVENOUS | Status: AC
  Administered 2024-01-07: 40 mg via INTRAVENOUS
  Filled 2024-01-07: qty 10

## 2024-01-07 MED ORDER — ONDANSETRON 4 MG PO TBDP: ORAL_TABLET | ORAL | 0 refills | Status: DC

## 2024-01-07 MED ORDER — ONDANSETRON HCL 4 MG/2ML IJ SOLN
4.0000 mg | Freq: Once | INTRAMUSCULAR | Status: AC
  Administered 2024-01-07: 4 mg via INTRAVENOUS
  Filled 2024-01-07: qty 2

## 2024-01-07 MED ORDER — SODIUM CHLORIDE 0.9 % IV BOLUS
1000.0000 mL | Freq: Once | INTRAVENOUS | Status: AC
  Administered 2024-01-07: 1000 mL via INTRAVENOUS

## 2024-01-07 NOTE — ED Triage Notes (Signed)
 Pt arrived from home via POV with multiple complaints. Pt states that she was sent to ED by Pcp d/t swelling in BLE. PCP thinks pt may have DVT. Pt states that she also vomited x 1 last night and has not slept all night because of abd pain. Pt states that her throat was sore last night, but is barely sore now.

## 2024-01-07 NOTE — ED Notes (Signed)
 Tolerating POs.

## 2024-01-07 NOTE — ED Notes (Signed)
 Given water/ PO challenge

## 2024-01-07 NOTE — ED Provider Notes (Signed)
 [place] EMERGENCY DEPARTMENT AT [facility] Provider Note   CSN: [id number] Arrival date & time: 01/07/24  1324     History  No chief complaint on file.   Christy Mitchell is a 53 y.o. female.  Patient is a 53 year old female who presents with nausea and vomiting.  She said she had a little bit of a sore throat a couple days ago.  Her throat feels better now but during the night she started having nausea and vomiting.  She has had multiple episodes of nausea and nonbilious, nonbloody emesis.  She denies any diarrhea.  She has some discomfort in her upper abdomen.  No cough or cold symptoms.  No known fevers.  No urinary symptoms.  Her husband is here at bedside and says that he is actually starting to get the same symptoms.  He feels nauseated and feels like he has to throw up.  They are wondering if they ate something that was bad although last night they cook dinner at home and there was not anything unusual about it.  Patient is also requesting to check her legs for blood clots.  She recently stopped hormone therapy.  She is having some crampy pain in both of her legs.  She has had some swelling in both of her legs although she says the swelling seems to be better today.  Her doctor said to come to the emergency room to get checked for blood clots.  She denies any chest pain or shortness of breath.       Home Medications Prior to Admission medications   Medication Sig Start Date End Date Taking? Authorizing Provider  ondansetron (ZOFRAN-ODT) 4 MG disintegrating tablet 4mg  ODT q4 hours prn nausea/vomit 01/07/24  Yes Rolan Bucco, MD  Black Cohosh 40 MG CAPS Take 1 capsule by mouth daily.    [provider]  cholecalciferol (VITAMIN D) 1000 units tablet Take 1,000 Units by mouth daily.    [provider]  Digestive Enzymes (DIGESTIVE ENZYME PO) Take 1 tablet by mouth daily.    [provider]  estradiol (VIVELLE-DOT) 0.025 MG/24HR  Place 1 patch onto the skin 2 (two) times a week. Patient not taking: Reported on 12/19/2023 09/28/23   Patton Salles, MD  hydrOXYzine (VISTARIL) 25 MG capsule Take 25 mg by mouth daily as needed. 11/14/23   [provider]  Maca Root (MACA PO) Take 1 tablet by mouth daily.    [provider]  Melatonin 10 MG CAPS Take 1 tablet by mouth at bedtime.    [provider]  Multiple Vitamins-Minerals (ONE-A-DAY WOMENS 50 PLUS PO) Take 1 capsule by mouth daily.    [provider]  progesterone (PROMETRIUM) 100 MG capsule Take 1 capsule (100 mg total) by mouth daily. Patient not taking: Reported on 12/19/2023 09/27/23   Patton Salles, MD  TRINTELLIX 5 MG TABS tablet Take 5 mg by mouth daily. 06/08/20   [provider]  vitamin B-12 (CYANOCOBALAMIN) 500 MCG tablet Take by mouth.    [provider]  zolpidem (AMBIEN CR) 12.5 MG CR tablet Take 12.5 mg by mouth at bedtime as needed for sleep.    [provider]      Allergies    Temazepam    Review of Systems   Review of Systems  Constitutional:  Positive for fatigue. Negative for chills, diaphoresis and fever.  HENT:  Positive for sore throat. Negative for congestion, rhinorrhea  and sneezing.   Eyes: Negative.   Respiratory:  Negative for cough, chest tightness and shortness of breath.   Cardiovascular:  Positive for leg swelling. Negative for chest pain.  Gastrointestinal:  Positive for abdominal pain, nausea and vomiting. Negative for blood in stool and diarrhea.  Genitourinary:  Negative for difficulty urinating, flank pain, frequency and hematuria.  Musculoskeletal:  Positive for myalgias. Negative for arthralgias and back pain.  Skin:  Negative for rash.  Neurological:  Negative for dizziness, speech difficulty, weakness, numbness and headaches.    Physical Exam Updated Vital Signs BP 107/75   Pulse 100   Temp 99 F (37.2 C) (Oral)   Resp 18   Ht 5\' 9"   (1.753 m)   Wt 95.2 kg   LMP 11/07/2021   SpO2 98%   BMI 31.01 kg/m  Physical Exam Constitutional:      Appearance: She is well-developed.  HENT:     Head: Normocephalic and atraumatic.  Eyes:     Pupils: Pupils are equal, round, and reactive to light.  Cardiovascular:     Rate and Rhythm: Normal rate and regular rhythm.     Heart sounds: Normal heart sounds.  Pulmonary:     Effort: Pulmonary effort is normal. No respiratory distress.     Breath sounds: Normal breath sounds. No wheezing or rales.  Chest:     Chest wall: No tenderness.  Abdominal:     General: Bowel sounds are normal.     Palpations: Abdomen is soft.     Tenderness: There is no abdominal tenderness. There is no guarding or rebound.  Musculoskeletal:        General: Normal range of motion.     Cervical back: Normal range of motion and neck supple.     Comments: No edema, minor soreness in the calves, no swelling or erythema.  Pedal pulses are intact  Lymphadenopathy:     Cervical: No cervical adenopathy.  Skin:    General: Skin is warm and dry.     Findings: No rash.  Neurological:     Mental Status: She is alert and oriented to person, place, and time.     ED Results / Procedures / Treatments   Labs (all labs ordered are listed, but only abnormal results are displayed) Labs Reviewed  CBC WITH DIFFERENTIAL/PLATELET - Abnormal; Notable for the following components:      Result Value   RBC 5.44 (*)    Hemoglobin 15.3 (*)    HCT 47.0 (*)    Lymphs Abs 0.4 (*)    All other components within normal limits  URINALYSIS, W/ REFLEX TO CULTURE (INFECTION SUSPECTED) - Abnormal; Notable for the following components:   Hgb urine dipstick TRACE (*)    Leukocytes,Ua TRACE (*)    Bacteria, UA RARE (*)    All other components within normal limits  COMPREHENSIVE METABOLIC PANEL - Abnormal; Notable for the following components:   Glucose, Bld 101 (*)    Calcium 8.3 (*)    All other components within normal limits   RESP PANEL BY RT-PCR (RSV, FLU A&B, COVID)  RVPGX2  LIPASE, BLOOD    EKG None  Radiology US Venous Img Lower Bilateral Result Date: 01/07/2024 CLINICAL DATA:  Bilateral calf swelling x1 month EXAM: BILATERAL LOWER EXTREMITY VENOUS DOPPLER ULTRASOUND TECHNIQUE: Gray-scale sonography with compression, as well as color and duplex ultrasound, were performed to evaluate the deep venous system(s) from the level of the common femoral vein through the popliteal and proximal  calf veins. COMPARISON:  None Available. FINDINGS: VENOUS Normal compressibility of the common femoral, superficial femoral, and popliteal veins, as well as the visualized calf veins. Visualized portions of profunda femoral vein and great saphenous vein unremarkable. No filling defects to suggest DVT on grayscale or color Doppler imaging. Doppler waveforms show normal direction of venous flow, normal respiratory plasticity and response to augmentation. OTHER None. Limitations: none IMPRESSION: Negative. Electronically Signed   By: Corlis Leak M.D.   On: 01/07/2024 09:14    Procedures Procedures    Medications Ordered in ED Medications  sodium chloride 0.9 % bolus 1,000 mL (0 mLs Intravenous Stopped 01/07/24 0918)  ondansetron (ZOFRAN) injection 4 mg (4 mg Intravenous Given 01/07/24 0803)  pantoprazole (PROTONIX) injection 40 mg (40 mg Intravenous Given 01/07/24 0807)  acetaminophen (TYLENOL) tablet 1,000 mg (1,000 mg Oral Given 01/07/24 8119)    ED Course/ Medical Decision Making/ A&P                                 Medical Decision Making Problems Addressed: Nausea and vomiting, unspecified vomiting type: acute illness or injury with systemic symptoms  Amount and/or Complexity of Data Reviewed Independent Historian: spouse External Data Reviewed: notes.    Details: Notes regarding need for Doppler ultrasound of her legs Labs: ordered. Decision-making details documented in ED Course. Radiology: ordered. Decision-making  details documented in ED Course.  Risk Prescription drug management. Decision regarding hospitalization.   Patient is a 53 year old who presents with nausea and vomiting.  Her husband is here with similar symptoms.  Respiratory panel is negative.  Labs reviewed and are nonconcerning.  She does not have other symptoms that would be more concerning for pancreatitis or cholecystitis.  Her lipase is normal.  LFTs are normal.  She does not have tenderness over her appendix.  She is postmenopausal so pregnancy test was not performed.  Urine is not consistent with infection.  She was given IV fluids, Zofran and Protonix.  She is feeling much better after this.  Repeat abdominal exam is benign.  Suspect that she has a viral illness.  She did have ultrasound of her lower extremities which were negative for DVT.  She was discharged home in good condition.  Symptomatic care instructions were given.  Return precautions were given.  Final Clinical Impression(s) / ED Diagnoses Final diagnoses:  Nausea and vomiting, unspecified vomiting type    Rx / DC Orders ED Discharge Orders          Ordered    ondansetron (ZOFRAN-ODT) 4 MG disintegrating tablet        01/07/24 1018              Rolan Bucco, MD 01/07/24 1021

## 2024-01-07 NOTE — ED Notes (Signed)
Ultrasound  progress  

## 2024-01-15 DIAGNOSIS — F5101 Primary insomnia: Secondary | ICD-10-CM | POA: Diagnosis not present

## 2024-01-15 DIAGNOSIS — E559 Vitamin D deficiency, unspecified: Secondary | ICD-10-CM | POA: Diagnosis not present

## 2024-01-15 DIAGNOSIS — F411 Generalized anxiety disorder: Secondary | ICD-10-CM | POA: Insufficient documentation

## 2024-01-15 DIAGNOSIS — E538 Deficiency of other specified B group vitamins: Secondary | ICD-10-CM | POA: Insufficient documentation

## 2024-01-16 ENCOUNTER — Ambulatory Visit (INDEPENDENT_AMBULATORY_CARE_PROVIDER_SITE_OTHER): Payer: BC Managed Care – PPO | Admitting: Obstetrics and Gynecology

## 2024-01-16 ENCOUNTER — Encounter: Payer: Self-pay | Admitting: Obstetrics and Gynecology

## 2024-01-16 ENCOUNTER — Other Ambulatory Visit (HOSPITAL_COMMUNITY)
Admission: RE | Admit: 2024-01-16 | Discharge: 2024-01-16 | Disposition: A | Discharge status: Home / Self Care | Source: Ambulatory Visit | Attending: Obstetrics and Gynecology | Admitting: Obstetrics and Gynecology

## 2024-01-16 VITALS — BP 126/72 | HR 88 | Ht 70.5 in | Wt 203.0 lb

## 2024-01-16 DIAGNOSIS — Z01419 Encounter for gynecological examination (general) (routine) without abnormal findings: Secondary | ICD-10-CM | POA: Diagnosis not present

## 2024-01-16 DIAGNOSIS — Z124 Encounter for screening for malignant neoplasm of cervix: Secondary | ICD-10-CM

## 2024-01-16 DIAGNOSIS — Z634 Disappearance and death of family member: Secondary | ICD-10-CM

## 2024-01-16 DIAGNOSIS — Z8742 Personal history of other diseases of the female genital tract: Secondary | ICD-10-CM | POA: Diagnosis not present

## 2024-01-16 DIAGNOSIS — Z1331 Encounter for screening for depression: Secondary | ICD-10-CM

## 2024-01-16 NOTE — Patient Instructions (Signed)
 Consider counseling with [facility]) or [facility].  EXERCISE AND DIET:  We recommended that you start or continue a regular exercise program for good health. Regular exercise means any activity that makes your heart beat faster and makes you sweat.  We recommend exercising at least 30 minutes per day at least 3 days a week, preferably 4 or 5.  We also recommend a diet low in fat and sugar.  Inactivity, poor dietary choices and obesity can cause diabetes, heart attack, stroke, and kidney damage, among others.    ALCOHOL AND SMOKING:  Women should limit their alcohol intake to no more than 7 drinks/beers/glasses of wine (combined, not each!) per week. Moderation of alcohol intake to this level decreases your risk of breast cancer and liver damage. And of course, no recreational drugs are part of a healthy lifestyle.  And absolutely no smoking or even second hand smoke. Most people know smoking can cause heart and lung diseases, but did you know it also contributes to weakening of your bones? Aging of your skin?  Yellowing of your teeth and nails?  CALCIUM AND VITAMIN D:  Adequate intake of calcium and Vitamin D are recommended.  The recommendations for exact amounts of these supplements seem to change often, but generally speaking 600 mg of calcium (either carbonate or citrate) and 800 units of Vitamin D per day seems prudent. Certain women may benefit from higher intake of Vitamin D.  If you are among these women, your doctor will have told you during your visit.    PAP SMEARS:  Pap smears, to check for cervical cancer or precancers,  have traditionally been done yearly, although recent scientific advances have shown that most women can have pap smears less often.  However, every woman still should have a physical exam from her gynecologist every year. It will include a breast check, inspection of the vulva and vagina to check for abnormal growths or skin changes, a visual exam of the cervix, and  then an exam to evaluate the size and shape of the uterus and ovaries.  And after 51 years of age, a rectal exam is indicated to check for rectal cancers. We will also provide age appropriate advice regarding health maintenance, like when you should have certain vaccines, screening for sexually transmitted diseases, bone density testing, colonoscopy, mammograms, etc.   MAMMOGRAMS:  All women over 39 years old should have a yearly mammogram. Many facilities now offer a "3D" mammogram, which may cost around $50 extra out of pocket. If possible,  we recommend you accept the option to have the 3D mammogram performed.  It both reduces the number of women who will be called back for extra views which then turn out to be normal, and it is better than the routine mammogram at detecting truly abnormal areas.    COLONOSCOPY:  Colonoscopy to screen for colon cancer is recommended for all women at age 37.  We know, you hate the idea of the prep.  We agree, BUT, having colon cancer and not knowing it is worse!!  Colon cancer so often starts as a polyp that can be seen and removed at colonscopy, which can quite literally save your life!  And if your first colonoscopy is normal and you have no family history of colon cancer, most women don't have to have it again for 10 years.  Once every ten years, you can do something that may end up saving your life, right?  We will be happy to  help you get it scheduled when you are ready.  Be sure to check your insurance coverage so you understand how much it will cost.  It may be covered as a preventative service at no cost, but you should check your particular policy.

## 2024-01-18 LAB — CYTOLOGY - PAP
Comment: NEGATIVE
Diagnosis: NEGATIVE
High risk HPV: NEGATIVE

## 2024-01-19 ENCOUNTER — Encounter: Payer: Self-pay | Admitting: Obstetrics and Gynecology

## 2024-01-19 DIAGNOSIS — F411 Generalized anxiety disorder: Secondary | ICD-10-CM | POA: Diagnosis not present

## 2024-01-22 DIAGNOSIS — F331 Major depressive disorder, recurrent, moderate: Secondary | ICD-10-CM | POA: Diagnosis not present

## 2024-01-23 DIAGNOSIS — F411 Generalized anxiety disorder: Secondary | ICD-10-CM | POA: Diagnosis not present

## 2024-02-05 DIAGNOSIS — E739 Lactose intolerance, unspecified: Secondary | ICD-10-CM | POA: Insufficient documentation

## 2024-02-05 DIAGNOSIS — F411 Generalized anxiety disorder: Secondary | ICD-10-CM | POA: Diagnosis not present

## 2024-02-05 DIAGNOSIS — D582 Other hemoglobinopathies: Secondary | ICD-10-CM | POA: Insufficient documentation

## 2024-02-05 DIAGNOSIS — F329 Major depressive disorder, single episode, unspecified: Secondary | ICD-10-CM | POA: Diagnosis not present

## 2024-02-05 DIAGNOSIS — Z23 Encounter for immunization: Secondary | ICD-10-CM | POA: Diagnosis not present

## 2024-02-05 DIAGNOSIS — E782 Mixed hyperlipidemia: Secondary | ICD-10-CM | POA: Insufficient documentation

## 2024-02-06 DIAGNOSIS — D582 Other hemoglobinopathies: Secondary | ICD-10-CM | POA: Diagnosis not present

## 2024-02-06 DIAGNOSIS — Z0184 Encounter for antibody response examination: Secondary | ICD-10-CM | POA: Diagnosis not present

## 2024-02-07 DIAGNOSIS — F331 Major depressive disorder, recurrent, moderate: Secondary | ICD-10-CM | POA: Diagnosis not present

## 2024-02-09 ENCOUNTER — Encounter: Payer: Self-pay | Admitting: Family Medicine

## 2024-02-09 ENCOUNTER — Ambulatory Visit: Admitting: Family Medicine

## 2024-02-09 VITALS — BP 116/78 | HR 78 | Temp 98.5°F | Ht 70.5 in | Wt 208.0 lb

## 2024-02-09 DIAGNOSIS — M25572 Pain in left ankle and joints of left foot: Secondary | ICD-10-CM | POA: Diagnosis not present

## 2024-02-09 DIAGNOSIS — Z23 Encounter for immunization: Secondary | ICD-10-CM | POA: Diagnosis not present

## 2024-02-09 DIAGNOSIS — G4709 Other insomnia: Secondary | ICD-10-CM

## 2024-02-09 DIAGNOSIS — F411 Generalized anxiety disorder: Secondary | ICD-10-CM

## 2024-02-09 DIAGNOSIS — Z0184 Encounter for antibody response examination: Secondary | ICD-10-CM | POA: Diagnosis not present

## 2024-02-09 MED ORDER — ZOLPIDEM TARTRATE ER 6.25 MG PO TBCR: 6.2500 mg | EXTENDED_RELEASE_TABLET | Freq: Every evening | ORAL | 0 refills | Status: DC | PRN

## 2024-02-09 MED ORDER — VORTIOXETINE HBR 20 MG PO TABS: 20.0000 mg | ORAL_TABLET | Freq: Every day | ORAL | 1 refills | Status: AC

## 2024-02-09 NOTE — Progress Notes (Signed)
 Patient Office Visit  Assessment & Plan:  Other insomnia -     Zolpidem Tartrate ER; Take 1 tablet (6.25 mg total) by mouth at bedtime as needed for sleep.  Dispense: 12 tablet; Refill: 0  Generalized anxiety disorder -     Vortioxetine HBr; Take 1 tablet (20 mg total) by mouth daily.  Dispense: 90 tablet; Refill: 1  Need for hepatitis B vaccination -     Heplisav-B (HepB-CPG) Vaccine  Immunity status testing  Acute left ankle pain   Test results were reviewed and analyzed as part of the medical decision making of this visit.  Reviewed previous notes from [facility], neurology notes, sleep study and recent lab work. Patient will reduce the Ambien CR to 6.25 mg nightly.  Told her that she should not be cutting the extended release Ambien 12.5 mg tablets.  Continue with the gabapentin 300 mg at nighttime for total of a month and then the next month she will go down to 200 mg of the gabapentin and then finally 100 mg nightly.  Patient will let us know when she needs a new prescription of the gabapentin 200 mg.  Return for physical next time.  Recommend healthy diet and consistent exercise.  Patient can also take the Tylenol for the next 5 to 7 days for left ankle sprain Recommend healthy diet i.e mediterranean/DASH diet, consistent exercise - 30 minutes 5 day per week, and gradual weight loss.  Return if symptoms worsen or fail to improve, for physical.   Subjective:    Patient ID: Christy Mitchell, female    DOB: [birth year]  Age: 53 y.o. MRN: [id number]  Chief Complaint  Patient presents with   Medical Management of Chronic Issues   Establish Care    HPI Establish primary care and discuss chronic medical issues.  Chronic insomnia.  Patient has been on multiple sleep aids in the past with various results and side effects.  Patient used to be on Ambien CR 12.5 which helped but then stopped working.  Patient also tried the Seroquel 150mg  at nighttime  prescribed by her psychiatrist but then that stopped working and was causing weight gain.  Patient tried the Lunesta, trazodone, Pamelor, Restoril and hydroxyzine but those were ineffective.  Belsomra could not be tried because insurance did not cover it.  Patient has been working with [facility] to try to get her off the Ambien CR.  Psychiatrist in [place] is in agreement with getting her off the Ambien CR due to increase risk of dementia. Pt has multiple family members with dementia.  Patient has been started on gabapentin 300 mg at night and that seems to help but she will like to come off of that too. (Previously patient did not want to try Gabapentin). Pt did do sleep study in [place] but did not qualify for CPAP.  Generalized anxiety disorder-patient's father passed away couple months ago and had severe bout of depression during that time - for about a month she was very depressed sad and unmotivated and could barely get out of bed.  Patient states she got over that and now is just feeling more anxiety.  Patient spoke with [place] psychiatrist/therapist regarding this and was told that it was more anxiety related.  Patient has been on Trintellix 20 mg once a day for some time and has been effective for her.  Patient had tried other medicines in the past which did not work for her.  Patient has been exercising and doing meditation and yoga which helps.  Patient does eat a healthy diet. Immunity status-patient did have immunity for measles mumps rubella.  Patient also has immunity for chickenpox..  Patient did not have immunity for hepatitis B.  Patient thinks she may have received hepatitis B vaccinations a long time ago but not sure.  Patient would rather start the vaccinations today. Pt would like to [facility] in the future and form requires either proof of vaccinations or checking immunity.  Health maintenance-patient does need a second Shingrix vaccine.  Patient has up-to-date  mammogram, up-to-date colonoscopy that was done in [place] in 2023.  Patient is not sure if she had polyps or not.  Patient will try to get that history and have it faxed to [place]. Left ankle sprain- pt fell recently and had bruising and swelling but is doing much better. Taking Tylenol and putting ice on it. Has been walking and doing fine with weightbearing The 10-year ASCVD risk score (Arnett DK, et al., 2019) is: 1.3%  Past Medical History:  Diagnosis Date   Anxiety    Endometriosis    Hormone disorder    Infertility, female    Kidney stones    Mitral valve prolapse    Past Surgical History:  Procedure Laterality Date   CESAREAN SECTION     CYSTOSCOPY W/ URETERAL STENT PLACEMENT Left 01/16/2017   Procedure: CYSTOSCOPY WITH RETROGRADE PYELOGRAM/URETEROSCOPY/ LASER LITHOTRIPSY/URETERAL STENT PLACEMENT;  Surgeon: Heloise Purpura, MD;  Location: [facility];  Service: Urology;  Laterality: Left;   Laparoscopic for endometriosis     uterus, ovary, and intestine   Social History   Tobacco Use   Smoking status: Never   Smokeless tobacco: Never  Vaping Use   Vaping status: Never Used  Substance Use Topics   Alcohol use: Yes    Alcohol/week: 1.0 standard drink of alcohol    Types: 1 Standard drinks or equivalent per week   Drug use: No   Family History  Problem Relation Age of Onset   Hypertension Mother    Anxiety disorder Mother    Lymphoma Father    Gallstones Sister    Alzheimer's disease Maternal Grandmother    Heart Problems Maternal Grandmother    Stroke Maternal Grandmother    Alzheimer's disease Maternal Grandfather    Alzheimer's disease Paternal Grandmother    Alzheimer's disease Paternal Grandfather    Allergies  Allergen Reactions   Temazepam Itching    ROS    Objective:    BP 116/78   Pulse 78   Temp 98.5 F (36.9 C)   Ht 5' 10.5" (1.791 m)   Wt 208 lb (94.3 kg)   LMP 11/07/2021   SpO2 99%   BMI 29.42 kg/m  BP Readings from Last 3 Encounters:   02/09/24 116/78  01/16/24 126/72  01/07/24 107/78   Wt Readings from Last 3 Encounters:  02/09/24 208 lb (94.3 kg)  01/16/24 203 lb (92.1 kg)  01/07/24 209 lb 15.6 oz (95.2 kg)    Physical Exam Vitals and nursing note reviewed.  Constitutional:      Appearance: Normal appearance.  HENT:     Head: Normocephalic.     Right Ear: Tympanic membrane, ear canal and external ear normal.     Left Ear: Tympanic membrane, ear canal and external ear normal.  Eyes:     Extraocular Movements: Extraocular movements intact.     Conjunctiva/sclera: Conjunctivae normal.     Pupils: Pupils are equal, round,  and reactive to light.  Cardiovascular:     Rate and Rhythm: Normal rate and regular rhythm.     Heart sounds: Normal heart sounds.  Pulmonary:     Effort: Pulmonary effort is normal.     Breath sounds: Normal breath sounds.  Musculoskeletal:     Right lower leg: No edema.     Left lower leg: No edema.  Neurological:     General: No focal deficit present.     Mental Status: She is alert and oriented to person, place, and time.  Psychiatric:        Mood and Affect: Mood normal.        Behavior: Behavior normal.        Thought Content: Thought content normal.        Judgment: Judgment normal.      No results found for any visits on 02/09/24.

## 2024-02-12 ENCOUNTER — Encounter: Payer: Self-pay | Admitting: Obstetrics and Gynecology

## 2024-02-12 ENCOUNTER — Ambulatory Visit: Admitting: Obstetrics and Gynecology

## 2024-02-12 VITALS — BP 130/82 | HR 87 | Ht 70.5 in | Wt 208.0 lb

## 2024-02-12 DIAGNOSIS — N762 Acute vulvitis: Secondary | ICD-10-CM | POA: Diagnosis not present

## 2024-02-12 LAB — WET PREP FOR TRICH, YEAST, CLUE

## 2024-02-12 MED ORDER — LIDOCAINE 5 % EX OINT: 1.0000 | TOPICAL_OINTMENT | Freq: Three times a day (TID) | CUTANEOUS | 1 refills | Status: AC

## 2024-02-12 NOTE — Telephone Encounter (Signed)
 LVMTCB, inquired if the pt could come in today at 4, arrival at 71?

## 2024-02-12 NOTE — Progress Notes (Unsigned)
 GYNECOLOGY  VISIT   HPI: 53 y.o.   Married  [place] female   [id number] with Patient's last menstrual period was 11/07/2021.   here for: perineal burning.    3 days ago, noted discomfort.   Pain with using toilet paper following urination.    No discharge or odor.   Using [employer] and Vagasil.   Has not been using vaginal estrogen cream.  Husband has oral HSV.     Reducing dose of Ambien.   Taking gabapentin.    GYNECOLOGIC HISTORY: Patient's last menstrual period was 11/07/2021. Contraception:  PMP Menopausal hormone therapy:  n/a Last 2 paps:  01/25/18 neg: HR HPV neg  History of abnormal Pap or positive HPV:  no Mammogram:  08/11/23 ([facility]) BI-RADS CAT 2 benign - bilateral dx - due to right breast pain. Right breast calcifications.  Due in Oct. 2025.         OB History     Gravida  1   Para  1   Term  0   Preterm  0   AB  0   Living  1      SAB  0   IAB  0   Ectopic  0   Multiple  0   Live Births  0              Patient Active Problem List   Diagnosis Date Noted   Need for hepatitis B vaccination 02/09/2024   Elevated hemoglobin (HCC) 02/05/2024   Lactose intolerance 02/05/2024   Generalized anxiety disorder 01/15/2024   Vitamin B12 deficiency 01/15/2024   Vitamin D deficiency 01/15/2024   Anxiety 03/10/2022   Endometriosis 03/10/2022   Panic attacks 03/10/2022   Bilateral carpal tunnel syndrome 05/08/2019   Insomnia 04/04/2019   Decreased libido 02/15/2018   Perimenopausal vasomotor symptoms 01/25/2018   Recurrent nephrolithiasis 12/17/2015    Past Medical History:  Diagnosis Date   Anxiety    Endometriosis    Hormone disorder    Infertility, female    Kidney stones    Mitral valve prolapse     Past Surgical History:  Procedure Laterality Date   CESAREAN SECTION     CYSTOSCOPY W/ URETERAL STENT PLACEMENT Left 01/16/2017   Procedure: CYSTOSCOPY WITH RETROGRADE PYELOGRAM/URETEROSCOPY/ LASER LITHOTRIPSY/URETERAL STENT  PLACEMENT;  Surgeon: Heloise Purpura, MD;  Location: [facility];  Service: Urology;  Laterality: Left;   Laparoscopic for endometriosis     uterus, ovary, and intestine    Current Outpatient Medications  Medication Sig Dispense Refill   cholecalciferol (VITAMIN D) 1000 units tablet Take 1,000 Units by mouth daily.     gabapentin (NEURONTIN) 300 MG capsule Take by mouth.     lidocaine (XYLOCAINE) 5 % ointment Apply 1 Application topically 3 (three) times daily. 1.25 g 1   Melatonin 10 MG CAPS Take 1 tablet by mouth at bedtime.     vortioxetine HBr (TRINTELLIX) 20 MG TABS tablet Take 1 tablet (20 mg total) by mouth daily. 90 tablet 1   zolpidem (AMBIEN CR) 6.25 MG CR tablet Take 6.25 mg by mouth at bedtime as needed for sleep.     zolpidem (AMBIEN CR) 6.25 MG CR tablet Take 1 tablet (6.25 mg total) by mouth at bedtime as needed for sleep. 12 tablet 0   gabapentin (NEURONTIN) 100 MG capsule Take 1 capsule (100 mg total) by mouth 2 (two) times daily. Take 200mg  at night time 60 capsule 1   No current facility-administered medications for this  visit.     ALLERGIES: Temazepam  Family History  Problem Relation Age of Onset   Hypertension Mother    Anxiety disorder Mother    Lymphoma Father    Gallstones Sister    Alzheimer's disease Maternal Grandmother    Heart Problems Maternal Grandmother    Stroke Maternal Grandmother    Alzheimer's disease Maternal Grandfather    Alzheimer's disease Paternal Grandmother    Alzheimer's disease Paternal Grandfather     Social History   Socioeconomic History   Marital status: Married    Spouse name: Not on file   Number of children: Not on file   Years of education: Not on file   Highest education level: Not on file  Occupational History   Not on file  Tobacco Use   Smoking status: Never   Smokeless tobacco: Never  Vaping Use   Vaping status: Never Used  Substance and Sexual Activity   Alcohol use: Yes    Alcohol/week: 1.0 standard drink of  alcohol    Types: 1 Standard drinks or equivalent per week   Drug use: No   Sexual activity: Yes    Partners: Male  Other Topics Concern   Not on file  Social History Narrative   Originally from [place]   Social Drivers of Health   Financial Resource Strain: Not on file  Food Insecurity: Low Risk  (06/20/2023)   Received from [facility]   Hunger Vital Sign    Worried About Running Out of Food in the Last Year: Never true    Ran Out of Food in the Last Year: Never true  Transportation Needs: Not on file (06/20/2023)  Physical Activity: Not on file  Stress: Not on file  Social Connections: Not on file  Intimate Partner Violence: Not on file    Review of Systems  All other systems reviewed and are negative.   PHYSICAL EXAMINATION:   BP 130/82 (BP Location: Left Arm, Patient Position: Sitting, Cuff Size: Small)   Pulse 87   Ht 5' 10.5" (1.791 m)   Wt 208 lb (94.3 kg)   LMP 11/07/2021   SpO2 99%   BMI 29.42 kg/m     General appearance: alert, cooperative and appears stated age  Pelvic: External genitalia:  minor split in the perineal skin.               Urethra:  normal appearing urethra with no masses, tenderness or lesions              Bartholins and Skenes: normal                 Vagina: normal appearing vagina with normal color and discharge, no lesions              Cervix: no lesions                Bimanual Exam:  Uterus:  normal size, contour, position, consistency, mobility, non-tender              Adnexa: no mass, fullness, tenderness           Chaperone was present for exam:  Warren Lacy, CMA  ASSESSMENT:  Vulvitis.   PLAN:  Wet prep:  negative yeast, negative clue cells, negative trichomonas. Nuswab sent for HSV 1 and 2 testing.  Rx for lidocaine 5% ointment tid prn.  No Rx for antiviral treatment at this time.  Patient in agreement.   25 min  total time was spent  for this patient encounter, including preparation, face-to-face counseling with the  patient, coordination of care, and documentation of the encounter.

## 2024-02-12 NOTE — Telephone Encounter (Signed)
 Appt confirmed. Encounter closed.

## 2024-02-13 ENCOUNTER — Ambulatory Visit

## 2024-02-13 ENCOUNTER — Ambulatory Visit: Admitting: Family Medicine

## 2024-02-13 DIAGNOSIS — Z23 Encounter for immunization: Secondary | ICD-10-CM

## 2024-02-13 MED ORDER — GABAPENTIN 100 MG PO CAPS: 100.0000 mg | ORAL_CAPSULE | Freq: Two times a day (BID) | ORAL | 1 refills | Status: AC

## 2024-02-13 NOTE — Addendum Note (Signed)
 Addended by: Angelica Chessman on: 02/13/2024 03:30 PM   Modules accepted: Orders

## 2024-02-13 NOTE — Progress Notes (Signed)
 Patient is in office today for a nurse visit for Immunization. Patient Injection was given in the  Left deltoid. Patient tolerated injection well.Pt received her Shingles injection #2. Pt also was given a hearing/vision screening for job requirement.

## 2024-02-14 ENCOUNTER — Other Ambulatory Visit: Payer: Self-pay | Admitting: Obstetrics and Gynecology

## 2024-02-14 ENCOUNTER — Encounter: Payer: Self-pay | Admitting: Obstetrics and Gynecology

## 2024-02-14 LAB — SURESWAB HSV, TYPE 1/2 DNA, PCR
HSV 1 DNA: NOT DETECTED
HSV 2 DNA: NOT DETECTED

## 2024-02-14 MED ORDER — ESTRADIOL 0.1 MG/GM VA CREA: TOPICAL_CREAM | VAGINAL | 2 refills | Status: AC

## 2024-02-14 NOTE — Progress Notes (Signed)
 Rx for vaginal estradiol cream.

## 2024-02-15 DIAGNOSIS — F411 Generalized anxiety disorder: Secondary | ICD-10-CM | POA: Diagnosis not present

## 2024-02-16 DIAGNOSIS — G8929 Other chronic pain: Secondary | ICD-10-CM | POA: Diagnosis not present

## 2024-02-16 DIAGNOSIS — S93402A Sprain of unspecified ligament of left ankle, initial encounter: Secondary | ICD-10-CM | POA: Insufficient documentation

## 2024-02-16 DIAGNOSIS — M25572 Pain in left ankle and joints of left foot: Secondary | ICD-10-CM | POA: Diagnosis not present

## 2024-02-16 DIAGNOSIS — S93401A Sprain of unspecified ligament of right ankle, initial encounter: Secondary | ICD-10-CM | POA: Diagnosis not present

## 2024-02-22 DIAGNOSIS — F411 Generalized anxiety disorder: Secondary | ICD-10-CM | POA: Diagnosis not present

## 2024-03-05 ENCOUNTER — Ambulatory Visit (INDEPENDENT_AMBULATORY_CARE_PROVIDER_SITE_OTHER): Admitting: Family Medicine

## 2024-03-05 ENCOUNTER — Encounter: Payer: Self-pay | Admitting: Family Medicine

## 2024-03-05 VITALS — BP 118/78 | HR 88 | Temp 98.2°F | Ht 70.5 in | Wt 209.5 lb

## 2024-03-05 DIAGNOSIS — Z0001 Encounter for general adult medical examination with abnormal findings: Secondary | ICD-10-CM

## 2024-03-05 DIAGNOSIS — E782 Mixed hyperlipidemia: Secondary | ICD-10-CM | POA: Diagnosis not present

## 2024-03-05 DIAGNOSIS — F411 Generalized anxiety disorder: Secondary | ICD-10-CM | POA: Diagnosis not present

## 2024-03-05 DIAGNOSIS — G4709 Other insomnia: Secondary | ICD-10-CM | POA: Diagnosis not present

## 2024-03-05 DIAGNOSIS — K5900 Constipation, unspecified: Secondary | ICD-10-CM

## 2024-03-05 DIAGNOSIS — Z0000 Encounter for general adult medical examination without abnormal findings: Secondary | ICD-10-CM | POA: Diagnosis not present

## 2024-03-05 LAB — COMPREHENSIVE METABOLIC PANEL WITH GFR
AG Ratio: 1.5 (calc) (ref 1.0–2.5)
ALT: 26 U/L (ref 6–29)
AST: 15 U/L (ref 10–35)
Albumin: 4.6 g/dL (ref 3.6–5.1)
Alkaline phosphatase (APISO): 85 U/L (ref 37–153)
BUN: 18 mg/dL (ref 7–25)
CO2: 27 mmol/L (ref 20–32)
Calcium: 9.6 mg/dL (ref 8.6–10.4)
Chloride: 103 mmol/L (ref 98–110)
Creat: 0.86 mg/dL (ref 0.50–1.03)
Globulin: 3 g/dL (ref 1.9–3.7)
Glucose, Bld: 82 mg/dL (ref 65–99)
Potassium: 4.4 mmol/L (ref 3.5–5.3)
Sodium: 138 mmol/L (ref 135–146)
Total Bilirubin: 0.5 mg/dL (ref 0.2–1.2)
Total Protein: 7.6 g/dL (ref 6.1–8.1)
eGFR: 82 mL/min/{1.73_m2} (ref >=60)

## 2024-03-05 LAB — LIPID PANEL
Cholesterol: 215 mg/dL — ABNORMAL HIGH (ref <200)
HDL: 46 mg/dL — ABNORMAL LOW (ref >=50)
LDL Cholesterol (Calc): 123 mg/dL — ABNORMAL HIGH
Non-HDL Cholesterol (Calc): 169 mg/dL — ABNORMAL HIGH (ref <130)
Total CHOL/HDL Ratio: 4.7 (calc) (ref <5.0)
Triglycerides: 319 mg/dL — ABNORMAL HIGH (ref <150)

## 2024-03-05 NOTE — Progress Notes (Signed)
 Complete physical exam  Assessment & Plan:    Routine Health Maintenance and Physical Exam Discussed health benefits of physical activity, and encouraged her to engage in regular exercise appropriate for her age and condition.  Preventative health care -     EKG 12-Lead -     Comprehensive metabolic panel with GFR  Other insomnia  Mixed hyperlipidemia -     Lipid panel  Generalized anxiety disorder  Constipation, unspecified constipation type   Pt will reduce Gabapentin  to 100mg  at night time for one month. Pt will return at that time. We will discuss reducing Trintellix  in the future.  EKG- NSR, no acute changes noted. Patient aware  Recommend healthy diet i.e mediterranean/DASH diet, consistent exercise - 30 minutes 5 day per week, and gradual weight loss. Info re fiber intake given to patient and patient will make sure she is getting enough fiber in her diet.  Return in about 4 weeks (around 04/02/2024), or if symptoms worsen or fail to improve, for follow up on insomnia.        Subjective:  Patient ID: Christy Mitchell, female    DOB: [birth year]  Age: 53 y.o. MRN: [id number] Chief Complaint  Patient presents with   Annual Exam    Christy Mitchell 2 weeks ago. Injury to left foot.     Christy Mitchell is a 53 y.o. female who presents today for a complete physical exam. Colonoscopy-UTD last one in [place]  (2023), needs repeat in 2033 Tetanus UTD 2025 Shingrix - UTD got both shots 2nd one here Pap smear- UTD negative  Mammogram UTD Hepatitis C and HIV testing- pt had this done this past January- negative.  Hyperlipidemia-not taking statin therapy.  Aware of need for diet control, exercise and healthy eating. Pt would like to recheck chol panel.  Insomnia- pt has come off the Ambien  and now taking Gabapentin  200mg  at night time. Pt will reduce to 100mg  at night time Anxiety- pt does see psychiatry via telemedicine. pt wants to come off Trintellix .  Started to do hypnosis via telemedicine which is helping her.  Pt has been on Trintellix  for several years.  Chronic constipation-patient does try to get high-fiber and takes over-the-counter stool softener and fiber supplement.  Patient sometimes uses over-the-counter MiraLAX  and Metamucil.  Patient states she has had this issue since she was a teenager.  Patient has an up-to-date colonoscopy. The 10-year ASCVD risk score (Arnett DK, et al., 2019) is: 1.5%   Values used to calculate the score:     Age: 75 years     Sex: Female     Is Non-Hispanic African American: No     Diabetic: No     Tobacco smoker: No     Systolic Blood Pressure: 118 mmHg     Is BP treated: No     HDL Cholesterol: 46 mg/dL     Total Cholesterol: 215 mg/dL  Health Maintenance  Topic Date Due   COVID-19 Vaccine (3 - 2024-25 season) 03/21/2024*   Flu Shot  06/07/2024   Mammogram  08/10/2025   Pap with HPV screening  01/15/2029   Colon Cancer Screening  03/29/2032   DTaP/Tdap/Td vaccine (5 - Td or Tdap) 02/04/2034   Hepatitis C Screening  Completed   HIV Screening  Completed   Zoster (Shingles) Vaccine  Completed   HPV Vaccine  Aged Out   Meningitis B Vaccine  Aged Out  *Topic was postponed. The date shown is not the original  due date.    Most recent fall risk assessment:    02/09/2024    2:31 PM  Fall Risk   Falls in the past year? 0  Number falls in past yr: 0  Injury with Fall? 0     Most recent depression screenings:    02/09/2024    2:31 PM 01/16/2024   12:03 PM  PHQ 2/9 Scores  PHQ - 2 Score 4 6  PHQ- 9 Score 13 14      The 10-year ASCVD risk score (Arnett DK, et al., 2019) is: 1.3%   Values used to calculate the score:     Age: 89 years     Sex: Female     Is Non-Hispanic African American: No     Diabetic: No     Tobacco smoker: No     Systolic Blood Pressure: 118 mmHg     Is BP treated: No     HDL Cholesterol: 53 mg/dL     Total Cholesterol: 227 mg/dL  Past Surgical History:   Procedure Laterality Date   CESAREAN SECTION     CYSTOSCOPY W/ URETERAL STENT PLACEMENT Left 01/16/2017   Procedure: CYSTOSCOPY WITH RETROGRADE PYELOGRAM/URETEROSCOPY/ LASER LITHOTRIPSY/URETERAL STENT PLACEMENT;  Surgeon: Florencio Hunting, MD;  Location: [facility];  Service: Urology;  Laterality: Left;   Laparoscopic for endometriosis     uterus, ovary, and intestine   Social History   Tobacco Use   Smoking status: Never   Smokeless tobacco: Never  Vaping Use   Vaping status: Never Used  Substance Use Topics   Alcohol use: Not Currently    Alcohol/week: 1.0 standard drink of alcohol   Drug use: No   Social History   Socioeconomic History   Marital status: Married    Spouse name: Not on file   Number of children: Not on file   Years of education: Not on file   Highest education level: Bachelor's degree (e.g., BA, AB, BS)  Occupational History   Not on file  Tobacco Use   Smoking status: Never   Smokeless tobacco: Never  Vaping Use   Vaping status: Never Used  Substance and Sexual Activity   Alcohol use: Not Currently    Alcohol/week: 1.0 standard drink of alcohol   Drug use: No   Sexual activity: Yes    Partners: Male    Birth control/protection: None    Comment: Menopause  Other Topics Concern   Not on file  Social History Narrative   Originally from [place], married. Dad passed away 2 mos ago January 08, 2024) in [place] from Lymphoma.    Social Drivers of Corporate investment banker Strain: Low Risk  (02/29/2024)   Overall Financial Resource Strain (CARDIA)    Difficulty of Paying Living Expenses: Not hard at all  Food Insecurity: No Food Insecurity (02/29/2024)   Hunger Vital Sign    Worried About Running Out of Food in the Last Year: Never true    Ran Out of Food in the Last Year: Never true  Transportation Needs: No Transportation Needs (02/29/2024)   PRAPARE - Administrator, Civil Service (Medical): No    Lack of Transportation (Non-Medical): No  Physical  Activity: Insufficiently Active (02/29/2024)   Exercise Vital Sign    Days of Exercise per Week: 3 days    Minutes of Exercise per Session: 40 min  Stress: No Stress Concern Present (02/29/2024)   [employer] of Occupational Health - Occupational Stress Questionnaire    Feeling  of Stress : Not at all  Social Connections: Moderately Isolated (02/29/2024)   Social Connection and Isolation Panel [NHANES]    Frequency of Communication with Friends and Family: Once a week    Frequency of Social Gatherings with Friends and Family: Once a week    Attends Religious Services: More than 4 times per year    Active Member of [employer] or Organizations: No    Attends Engineer, structural: Not on file    Marital Status: Married  Catering manager Violence: Not on file   Family History  Problem Relation Age of Onset   Hypertension Mother    Anxiety disorder Mother    Lymphoma Father    Cancer Father    Gallstones Sister    Alzheimer's disease Maternal Grandmother    Heart Problems Maternal Grandmother    Stroke Maternal Grandmother    Vision loss Maternal Grandmother    Alzheimer's disease Maternal Grandfather    Alzheimer's disease Paternal Grandmother    Alzheimer's disease Paternal Grandfather    Breast cancer Neg Hx    Colon cancer Neg Hx    Uterine cancer Neg Hx    Ovarian cancer Neg Hx    Allergies  Allergen Reactions   Temazepam Itching     Patient Care Team: Amadeo June, MD as PCP - General (Family Medicine)   Outpatient Medications Prior to Visit  Medication Sig   cholecalciferol (VITAMIN D) 1000 units tablet Take 1,000 Units by mouth daily.   estradiol  (ESTRACE ) 0.1 MG/GM vaginal cream Use 1/2 g vaginally and a dime size amount to the vulva every night for the first 2 weeks, then use 1/2 g vaginally and a dime size amount to the vulva two or three times per week as needed to maintain symptom relief.   gabapentin  (NEURONTIN ) 100 MG capsule Take 1 capsule (100 mg  total) by mouth 2 (two) times daily. Take 200mg  at night time   lidocaine  (XYLOCAINE ) 5 % ointment Apply 1 Application topically 3 (three) times daily.   Melatonin 10 MG CAPS Take 1 tablet by mouth at bedtime.   vortioxetine  HBr (TRINTELLIX ) 20 MG TABS tablet Take 1 tablet (20 mg total) by mouth daily.   [DISCONTINUED] gabapentin  (NEURONTIN ) 300 MG capsule Take by mouth.   zolpidem  (AMBIEN  CR) 6.25 MG CR tablet Take 1 tablet (6.25 mg total) by mouth at bedtime as needed for sleep. (Patient not taking: Reported on 03/05/2024)   [DISCONTINUED] diclofenac (VOLTAREN) 75 MG EC tablet Take 75 mg by mouth 2 (two) times daily. (Patient not taking: Reported on 03/05/2024)   [DISCONTINUED] zolpidem  (AMBIEN  CR) 6.25 MG CR tablet Take 6.25 mg by mouth at bedtime as needed for sleep. (Patient not taking: Reported on 03/05/2024)   No facility-administered medications prior to visit.    ROS     Objective:    BP 118/78   Pulse 88   Temp 98.2 F (36.8 C)   Ht 5' 10.5" (1.791 m)   Wt 209 lb 8 oz (95 kg)   LMP 11/07/2021   SpO2 99%   BMI 29.64 kg/m  BP Readings from Last 3 Encounters:  03/05/24 118/78  02/12/24 130/82  02/09/24 116/78   Wt Readings from Last 3 Encounters:  03/05/24 209 lb 8 oz (95 kg)  02/12/24 208 lb (94.3 kg)  02/09/24 208 lb (94.3 kg)    Physical Exam Vitals and nursing note reviewed.  Constitutional:      Appearance: Normal appearance.  HENT:  Head: Normocephalic.     Right Ear: Tympanic membrane, ear canal and external ear normal.     Left Ear: Tympanic membrane, ear canal and external ear normal.  Eyes:     Extraocular Movements: Extraocular movements intact.     Conjunctiva/sclera: Conjunctivae normal.     Pupils: Pupils are equal, round, and reactive to light.  Cardiovascular:     Rate and Rhythm: Normal rate and regular rhythm.     Heart sounds: Normal heart sounds.  Pulmonary:     Effort: Pulmonary effort is normal.     Breath sounds: Normal breath  sounds. No wheezing.  Chest:  Breasts:    Right: Normal. No inverted nipple, mass, nipple discharge or tenderness.     Left: Normal. No inverted nipple, mass, nipple discharge or tenderness.  Abdominal:     General: Bowel sounds are normal.     Tenderness: There is no abdominal tenderness. There is no guarding.     Hernia: No hernia is present.  Musculoskeletal:     Cervical back: Normal.     Thoracic back: Normal.     Lumbar back: Normal.     Right hip: Normal.     Left hip: Normal.     Right lower leg: No edema.     Comments: Patient has a boot in left leg.   Neurological:     General: No focal deficit present.     Mental Status: She is alert and oriented to person, place, and time.     Cranial Nerves: Cranial nerves 2-12 are intact.     Coordination: Romberg sign negative. Coordination normal. Finger-Nose-Finger Test normal.     Gait: Gait is intact. Gait and tandem walk normal.     Deep Tendon Reflexes: Reflexes are normal and symmetric.  Psychiatric:        Mood and Affect: Mood normal.        Behavior: Behavior normal.        Thought Content: Thought content normal.        Judgment: Judgment normal.      Results for orders placed or performed in visit on 03/05/24  [facility] HEPATITIS C SCREENING LAB  Result Value Ref Range   [facility] Hepatitis Screen Negative-Validated         Amadeo June, MD

## 2024-03-06 ENCOUNTER — Encounter: Payer: Self-pay | Admitting: Family Medicine

## 2024-03-08 DIAGNOSIS — S93401A Sprain of unspecified ligament of right ankle, initial encounter: Secondary | ICD-10-CM | POA: Diagnosis not present

## 2024-03-08 DIAGNOSIS — M25472 Effusion, left ankle: Secondary | ICD-10-CM | POA: Diagnosis not present

## 2024-03-08 DIAGNOSIS — M25572 Pain in left ankle and joints of left foot: Secondary | ICD-10-CM | POA: Diagnosis not present

## 2024-03-15 DIAGNOSIS — F331 Major depressive disorder, recurrent, moderate: Secondary | ICD-10-CM | POA: Diagnosis not present

## 2024-03-18 DIAGNOSIS — F411 Generalized anxiety disorder: Secondary | ICD-10-CM | POA: Diagnosis not present

## 2024-04-08 ENCOUNTER — Ambulatory Visit: Admitting: Family Medicine

## 2024-06-03 DIAGNOSIS — F411 Generalized anxiety disorder: Secondary | ICD-10-CM | POA: Diagnosis not present

## 2024-07-27 ENCOUNTER — Encounter: Payer: Self-pay | Admitting: Family Medicine

## 2024-07-30 ENCOUNTER — Other Ambulatory Visit: Payer: Self-pay

## 2024-07-30 ENCOUNTER — Telehealth: Payer: Self-pay

## 2024-07-30 DIAGNOSIS — E739 Lactose intolerance, unspecified: Secondary | ICD-10-CM

## 2024-07-30 DIAGNOSIS — R112 Nausea with vomiting, unspecified: Secondary | ICD-10-CM

## 2024-07-30 DIAGNOSIS — K2100 Gastro-esophageal reflux disease with esophagitis, without bleeding: Secondary | ICD-10-CM

## 2024-07-30 MED ORDER — OMEPRAZOLE 40 MG PO CPDR: 40.0000 mg | DELAYED_RELEASE_CAPSULE | Freq: Every day | ORAL | 3 refills | Status: AC

## 2024-07-30 MED ORDER — ONDANSETRON HCL 4 MG PO TABS: 4.0000 mg | ORAL_TABLET | Freq: Three times a day (TID) | ORAL | 0 refills | Status: AC | PRN

## 2024-07-30 NOTE — Telephone Encounter (Signed)
 Aletha Bene, MD to Me (Selected Message)     07/30/24  9:27 AM Yes, can have nausea medicine- Zofran  4mg  every 8 hrs prn nausea #30 thanks

## 2024-08-02 DIAGNOSIS — F331 Major depressive disorder, recurrent, moderate: Secondary | ICD-10-CM | POA: Diagnosis not present

## 2024-10-02 ENCOUNTER — Ambulatory Visit: Admitting: Family Medicine

## 2024-10-16 ENCOUNTER — Other Ambulatory Visit (HOSPITAL_BASED_OUTPATIENT_CLINIC_OR_DEPARTMENT_OTHER): Payer: Self-pay | Admitting: Family Medicine

## 2024-10-16 DIAGNOSIS — Z1231 Encounter for screening mammogram for malignant neoplasm of breast: Secondary | ICD-10-CM

## 2024-10-17 ENCOUNTER — Inpatient Hospital Stay (HOSPITAL_BASED_OUTPATIENT_CLINIC_OR_DEPARTMENT_OTHER): Admission: RE | Admit: 2024-10-17 | Source: Ambulatory Visit

## 2024-10-20 ENCOUNTER — Ambulatory Visit (HOSPITAL_BASED_OUTPATIENT_CLINIC_OR_DEPARTMENT_OTHER)

## 2024-10-24 ENCOUNTER — Inpatient Hospital Stay (HOSPITAL_BASED_OUTPATIENT_CLINIC_OR_DEPARTMENT_OTHER): Admission: RE | Admit: 2024-10-24 | Discharge: 2024-10-24 | Attending: Family Medicine | Admitting: Family Medicine

## 2024-10-24 ENCOUNTER — Encounter (HOSPITAL_BASED_OUTPATIENT_CLINIC_OR_DEPARTMENT_OTHER): Payer: Self-pay

## 2024-10-24 DIAGNOSIS — M7711 Lateral epicondylitis, right elbow: Secondary | ICD-10-CM | POA: Diagnosis not present

## 2024-10-24 DIAGNOSIS — Z1231 Encounter for screening mammogram for malignant neoplasm of breast: Secondary | ICD-10-CM | POA: Insufficient documentation

## 2024-10-29 DIAGNOSIS — F331 Major depressive disorder, recurrent, moderate: Secondary | ICD-10-CM | POA: Diagnosis not present

## 2024-11-13 DIAGNOSIS — M7710 Lateral epicondylitis, unspecified elbow: Secondary | ICD-10-CM | POA: Insufficient documentation

## 2024-11-30 NOTE — Progress Notes (Signed)
 "  [address], [address] [place], [place] [address] [address] , [address] [place], [place] [address]    CURRENT URGENT CARE ENCOUNTER ORDERS No orders of the defined types were placed in this encounter.  _____________________________________________________________________  CHIEF COMPLAINT   Chief Complaint  Patient presents with   Facial Pain    On left side of face comes and goes, hard to eat.  Patient presents with her husband.  States she started yesterday with some pain to the left side of her face which has worsened today.  States when drinking cold liquids she has discomfort in her upper and lower teeth on the left side.  She points to the area of the left TMJ as the point of maximum pain.  States the pain radiates superiorly and inferiorly.  She has not taken anything specifically for her discomfort.  She does not smoke.  She denies any fever.  States that is uncomfortable when she opens and closes her jaw.    Review of Systems:  Pertinent positives and negatives are incorporated into the HPI.  Any other systems reviewed were non-contributory.  Records Review: Previous medical records have been read, reviewed and appreciated, if available for review; current nursing notes and previous provider notes have been read, reviewed and appreciated as well.  Chart Review: Allergies, Current Medications, Past Medical History, Past Surgical History, Family History, Social History and the Current Active Problem List has been read, reviewed and appreciated.  PAST MEDICAL HISTORY   Medical History[1]   SURGICAL HISTORY   Surgical History[2]  CURRENT MEDICATIONS  Medications Ordered Prior to Encounter[3]   ALLERGIES   Allergies[4]  FAMILY HISTORY   Family History[5]  SOCIAL HISTORY   Tobacco Use History[6] Social History   Substance and Sexual Activity  Alcohol Use Not Currently   Social History   Substance and Sexual Activity  Drug Use Never    PHYSICAL  EXAM   Vital Signs:  BP 129/79 (BP Location: Right arm)   Pulse 89   Temp 98.3 F (36.8 C) (Temporal)   Ht 1.753 m (5' 9)   Wt 96.2 kg (212 lb)   SpO2 98%   BMI 31.31 kg/m    GENERAL: The patient appears well, alert, oriented x 3, in no acute distress. HEENT: PERLA, EOMI, TM's clear, pharynx clear, oral mucosa moist.  No obvious gum swelling or erythema.  No obvious gum infection.  There is moderate tenderness over the left TMJ. HEAD: normocephalic, atraumatic NECK: supple, no meningeal signs.  No adenopathy. NEUROLOGICAL: orientation/mental status grossly normal, No obvious sensory or motor deficits. SKIN: Warm, dry PSYCHIATRIC: Alert, cooperative  Results: No results found for this or any previous visit (from the past 24 hours).       MDM: Vitals nonacute Physical exam is benign, other than tenderness over the left TMJ.  Suspect this may be a factor in the patient's symptoms.  She is already taking meloxicam.  I will add a Sterapred pack.  She is instructed the pain is not essentially dissipated by Tuesday or Wednesday she should follow-up with PCP.      FINAL IMPRESSIONS    1. Facial pain   2. TMJ dysfunction     Problem List With Orders Addressed This Visit:  Problem List Items Addressed This Visit   None Visit Diagnoses       Facial pain    -  Primary     TMJ dysfunction  Requested Consultations: The patient is advised to follow up with their current PCP.   Routine symptom specific, illness specific and/or disease specific instructions were discussed with the patient and/or caregiver at length; Patient presented with an acute illness with associated systemic symptoms and significant discomfort requiring acute urgent management.  In my opinion, this is a condition that a prudent lay person (someone who possesses an average knowledge of health and medicine) may potentially expect to result in serious jeopardy, cause serious impairment of bodily  function or result in serious dysfunction of bodily organs.  As such, the patient has been evaluated and assessed; workup and treatment have begun; but the patient may require follow up for further testing and treatment if the symptoms continue in spite of treatment, as clinically indicated and appropriate.  Counselled regarding currently diagnosed medical/surgical condition(s) and all patient/family questions answered to their satisfaction and understanding was subsequently voiced.  Correct use information and potential side effects of new medication(s) were discussed at length. Reviewed worrisome signs and symptoms to watch for; patient/family instructed to call, return to office or go directly to the Emergency Department for any worsening, prolonged, changing symptoms or if new symptoms develop. I have discussed the signs and symptoms that would warrant Emergency Department evaluation. The patient/family has been advised to follow up with their primary care provider if symptoms persist.  Follow up with PCP in 3-5 days if no better; to PCP or the Emergency Department if new signs and symptoms develop, or if the current signs or symptoms continue to worsen.   The patient and any family present were given verbal and written discharge instructions as clinically indicated and appropriate; they expressed understanding and agreement to the instructions as given.  This electronic health record was dictated using Conservation officer, historic buildings. Although it was checked for errors, there may be errors related to utilization of this dictation software.  Electronically signed by: Toribio Buss, DO 11/30/2024 4:03 PM       [1] Past Medical History: Diagnosis Date   Anxiety    Endometriosis    Insomnia    Vertigo   [2] Past Surgical History: Procedure Laterality Date   CESAREAN SECTION, UNSPECIFIED     Procedure: CESAREAN SECTION   KIDNEY SURGERY     Procedure: KIDNEY SURGERY    RHINOPLASTY     Corrected deviated septum   URETERAL STENT PLACEMENT    [3] Current Outpatient Medications on File Prior to Visit  Medication Sig Dispense Refill   azelastine (ASTELIN) 137 mcg (0.1 %) nasal spray Administer 1-2 sprays into each nostril 2 (two) times a day as needed for rhinitis. 30 mL 11   cholecalciferol (VITAMIN D3) 1,000 unit (25 mcg) tablet Take 1,000 Units by mouth.     cyanocobalamin  (VITAMIN B12) 500 mcg tablet Take 500 mcg by mouth Once Daily.     estradioL  (VIVELLE -DOT) 0.025 mg/24 hr transdermal patch Place 1 patch on the skin 2 (two) times a week.     gabapentin  (NEURONTIN ) 300 mg capsule Take 1 capsule (300 mg total) by mouth nightly as needed (Insomnia). 30 capsule 0   melatonin tablet Take 10 mg by mouth nightly.     meloxicam (MOBIC) 15 mg tablet Take 1 tablet (15 mg total) by mouth daily. 30 tablet 0   omeprazole  (PriLOSEC) 40 mg DR capsule Take 40 mg by mouth daily.     ondansetron  (ZOFRAN ) 4 mg tablet Take 4 mg by mouth every 8 (eight) hours as needed for nausea  or vomiting.     progesterone  (PROMETRIUM ) 100 mg cap capsule Take 1 capsule by mouth daily.     vortioxetine  (TRINTELLIX ) 20 mg tab tablet Take 1 tablet (20 mg total) by mouth daily. 90 tablet 1   zolpidem  (AMBIEN  CR) 12.5 mg ER tablet Take 1 tablet (12.5 mg total) by mouth nightly as needed for sleep. 30 tablet 0   No current facility-administered medications on file prior to visit.  [4] Allergies Allergen Reactions   Temazepam Itching  [5] Family History Problem Relation Name Age of Onset   Anxiety disorder Mother     Hypertension Mother     Lymphoma Father Wallis Roach    Cancer Father Wallis Roach        lymphoma   Cholelithiasis Sister     Dementia Maternal Grandmother     Stroke Maternal Grandmother     Dementia Paternal Grandmother     Dementia Paternal Grandfather     Breast cancer Neg Hx     Clotting disorder Neg Hx    [6] Social History Tobacco Use   Smoking Status Never  Smokeless Tobacco Never  "

## 2024-12-03 ENCOUNTER — Ambulatory Visit: Admitting: Family Medicine

## 2024-12-03 ENCOUNTER — Encounter: Payer: Self-pay | Admitting: Family Medicine

## 2024-12-03 VITALS — BP 124/84 | HR 82 | Ht 70.5 in | Wt 213.0 lb

## 2024-12-03 DIAGNOSIS — M26622 Arthralgia of left temporomandibular joint: Secondary | ICD-10-CM

## 2024-12-03 DIAGNOSIS — R519 Headache, unspecified: Secondary | ICD-10-CM | POA: Diagnosis not present

## 2024-12-03 MED ORDER — PREDNISONE 10 MG (21) PO TBPK: ORAL_TABLET | ORAL | 0 refills | Status: AC

## 2024-12-03 NOTE — Progress Notes (Signed)
 "  Patient Office Visit  Assessment & Plan:  Left facial pain -     predniSONE ; Use as directed.  Dispense: 21 each; Refill: 0  Arthralgia of left temporomandibular joint   Assessment and Plan    Left facial pain and arthralgia of left temporomandibular joint vs Trigeminal Neuralgia Intermittent left facial pain and arthralgia likely TMJ-related. Improved with prednisone  and ibuprofen. No signs of Bell's palsy or stroke. No significant dental issues. Pain exacerbated by chewing and temperature changes. - Monitor symptoms post-prednisone . - Follow up with dentist for TMJ and dental evaluation. - Consider further investigation if symptoms worsen or persist.  Will consider imaging (MRI) if symptoms do not continue to improve or worsen.      If patient does not improve we may need to order MRI of the brain with and without contrast.  Neurology consult as an alternative option.  Prednisone  taper was sent to the pharmacy in case her symptoms return.  Patient does not want to start gabapentin  at this time since she had previously used it for insomnia and came off of it.  Patient does have an upcoming dental appointment tomorrow  No follow-ups on file.   Subjective:    Patient ID: Christy Mitchell, female    DOB: 01-Jan-1971  Age: 54 y.o. MRN: [id number]  Chief Complaint  Patient presents with   Pain    Pain on the left side of her face radiating from her temporal region to her teeth. Pt was seen at [facility] on Saturday and was given prednisone  and ibuprofen. The pain comes and goes.    HPI Discussed the use of AI scribe software for clinical note transcription with the patient, who gave verbal consent to proceed.  History of Present Illness       History of Present Illness Christy Mitchell is a 54 year old female who presents with left sided facial pain and dental discomfort.  She experiences facial pain that began on the left side of her face, radiating to  her ear and temple. The pain started on a Friday with discomfort in her teeth and progressed to facial pain by Saturday. It is described as intermittent and severe, prompting her to visit urgent care before a snowstorm. she went to Urgent Care in [place] on Saturday for evaluation  At urgent care, she was prescribed prednisone  and advised to continue her current anti-inflammatory medication for her elbow. She reports that her pain has improved by approximately 60% since starting the medication. She reports that she has been able to eat on the left side again.  She denies any recent dental issues, having visited her dentist recently with no findings of cavities or other dental problems. However, she noticed a small bump at the back of her mouth, not near the teeth, which she plans to discuss with her dentist. Has upcoming dental appt tomorrow  She uses a mouthguard at night to prevent teeth grinding, which she believes helps with her symptoms. Initially, the pain was so severe that it affected her ability to concentrate and perform daily activities, but she has not experienced any numbness or tingling recently or nerve pain per se. patient started reading about trigeminal neuralgia and became concerned.   No symptoms of Bell's palsy, stroke, migraines, or sinusitis. She reports that her vision was slightly blurred on the left side during the pain episodes, but this has resolved. 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  Physical Exam HEENT: Head and throat are symmetrical with no signs of paralysis or Bell's palsy. NECK: Neck is symmetrical with no signs of paralysis or Bell's palsy.  Assessment and Plan Left facial pain and arthralgia of left temporomandibular joint vs Trigeminal Neuralgia Intermittent left facial pain and arthralgia likely TMJ-related. Improved with prednisone  and ibuprofen. No signs of Bell's palsy or stroke.  No significant dental issues. Pain exacerbated by chewing and temperature changes. - Monitor symptoms post-prednisone . - Follow up with dentist for TMJ and dental evaluation. - Consider further investigation if symptoms worsen or persist.  Will consider imaging (MRI) if symptoms do not continue to improve or worsen.   The 10-year ASCVD risk score (Arnett DK, et al., 2019) is: 1.7%  Past Medical History:  Diagnosis Date   Anxiety    Endometriosis    Hormone disorder    Infertility, female    Kidney stones    Mitral valve prolapse    Past Surgical History:  Procedure Laterality Date   CESAREAN SECTION     CYSTOSCOPY W/ URETERAL STENT PLACEMENT Left 01/16/2017   Procedure: CYSTOSCOPY WITH RETROGRADE PYELOGRAM/URETEROSCOPY/ LASER LITHOTRIPSY/URETERAL STENT PLACEMENT;  Surgeon: Gretel Ferrara, MD;  Location: [facility];  Service: Urology;  Laterality: Left;   Laparoscopic for endometriosis     uterus, ovary, and intestine   Social History[1] Family History  Problem Relation Age of Onset   Hypertension Mother    Anxiety disorder Mother    Lymphoma Father    Cancer Father    Gallstones Sister    Alzheimer's disease Maternal Grandmother    Heart Problems Maternal Grandmother    Stroke Maternal Grandmother    Vision loss Maternal Grandmother    Alzheimer's disease Maternal Grandfather    Alzheimer's disease Paternal Grandmother    Alzheimer's disease Paternal Grandfather    Breast cancer Neg Hx    Colon cancer Neg Hx    Uterine cancer Neg Hx    Ovarian cancer Neg Hx    Multiple sclerosis Neg Hx    Allergies[2]  ROS    Objective:    BP 124/84   Pulse 82   Ht 5' 10.5 (1.791 m)   Wt 213 lb (96.6 kg)   LMP 11/07/2021   SpO2 97%   BMI 30.13 kg/m  BP Readings from Last 3 Encounters:  12/03/24 124/84  03/05/24 118/78  02/12/24 130/82   Wt Readings from Last 3 Encounters:  12/03/24 213 lb (96.6 kg)  03/05/24 209 lb 8 oz (95 kg)  02/12/24 208 lb (94.3 kg)    Physical  Exam Vitals and nursing note reviewed.  Constitutional:      General: She is not in acute distress.    Appearance: Normal appearance.  HENT:     Head: Normocephalic.     Jaw: No trismus, tenderness, swelling, pain on movement or malocclusion.     Right Ear: Tympanic membrane, ear canal and external ear normal.     Left Ear: Tympanic membrane, ear canal and external ear normal.     Mouth/Throat:     Pharynx: No oropharyngeal exudate or posterior oropharyngeal erythema.     Comments: Tiny lesion/ulcer posterior mucosa Eyes:     Extraocular Movements: Extraocular movements intact.     Pupils: Pupils are equal, round, and reactive to light.  Cardiovascular:     Rate and Rhythm: Normal rate and regular rhythm.     Heart sounds: Normal heart sounds.  Pulmonary:  Effort: Pulmonary effort is normal.     Breath sounds: Normal breath sounds.  Musculoskeletal:     Right lower leg: No edema.     Left lower leg: No edema.  Neurological:     General: No focal deficit present.     Mental Status: She is alert and oriented to person, place, and time.     Cranial Nerves: Cranial nerves 2-12 are intact. No cranial nerve deficit or facial asymmetry.     Motor: No weakness.     Comments: Muscles of facial expression intact. No facial droop  Psychiatric:        Mood and Affect: Mood normal.        Behavior: Behavior normal.        Thought Content: Thought content normal.        Judgment: Judgment normal.      No results found for any visits on 12/03/24.          [1]  Social History Tobacco Use   Smoking status: Never   Smokeless tobacco: Never  Vaping Use   Vaping status: Never Used  Substance Use Topics   Alcohol use: Not Currently    Alcohol/week: 1.0 standard drink of alcohol   Drug use: No  [2]  Allergies Allergen Reactions   Temazepam Itching   "
